# Patient Record
Sex: Male | Born: 1995 | Race: White | Hispanic: No | Marital: Single | Smoking: Never smoker
Health system: Southern US, Community
[De-identification: ages and names within clinical notes are randomized; demographics above are authoritative.]

---

## 2017-01-30 ENCOUNTER — Inpatient Hospital Stay (HOSPITAL_COMMUNITY)
Admission: RE | Admit: 2017-01-30 | Discharge: 2017-02-02 | DRG: 885 | Disposition: A | Discharge status: Home / Self Care | Payer: 59 | Attending: Psychiatry | Admitting: Psychiatry

## 2017-01-30 ENCOUNTER — Encounter (HOSPITAL_COMMUNITY): Payer: Self-pay | Admitting: *Deleted

## 2017-01-30 DIAGNOSIS — G4700 Insomnia, unspecified: Secondary | ICD-10-CM | POA: Diagnosis present

## 2017-01-30 DIAGNOSIS — F1410 Cocaine abuse, uncomplicated: Secondary | ICD-10-CM | POA: Diagnosis present

## 2017-01-30 DIAGNOSIS — Z818 Family history of other mental and behavioral disorders: Secondary | ICD-10-CM | POA: Diagnosis not present

## 2017-01-30 DIAGNOSIS — F1110 Opioid abuse, uncomplicated: Secondary | ICD-10-CM | POA: Diagnosis present

## 2017-01-30 DIAGNOSIS — F1120 Opioid dependence, uncomplicated: Secondary | ICD-10-CM | POA: Diagnosis present

## 2017-01-30 DIAGNOSIS — Z915 Personal history of self-harm: Secondary | ICD-10-CM | POA: Diagnosis not present

## 2017-01-30 DIAGNOSIS — F322 Major depressive disorder, single episode, severe without psychotic features: Secondary | ICD-10-CM | POA: Diagnosis present

## 2017-01-30 DIAGNOSIS — F332 Major depressive disorder, recurrent severe without psychotic features: Secondary | ICD-10-CM | POA: Diagnosis not present

## 2017-01-30 DIAGNOSIS — F1124 Opioid dependence with opioid-induced mood disorder: Secondary | ICD-10-CM

## 2017-01-30 DIAGNOSIS — F419 Anxiety disorder, unspecified: Secondary | ICD-10-CM | POA: Diagnosis present

## 2017-01-30 DIAGNOSIS — F39 Unspecified mood [affective] disorder: Secondary | ICD-10-CM | POA: Diagnosis not present

## 2017-01-30 LAB — RAPID URINE DRUG SCREEN, HOSP PERFORMED
Amphetamines: NOT DETECTED
Barbiturates: NOT DETECTED
Benzodiazepines: NOT DETECTED
COCAINE: POSITIVE — AB
OPIATES: POSITIVE — AB
Tetrahydrocannabinol: NOT DETECTED

## 2017-01-30 LAB — URINALYSIS, ROUTINE W REFLEX MICROSCOPIC
BILIRUBIN URINE: NEGATIVE
Glucose, UA: NEGATIVE mg/dL
HGB URINE DIPSTICK: NEGATIVE
Ketones, ur: NEGATIVE mg/dL
Leukocytes, UA: NEGATIVE
NITRITE: NEGATIVE
PROTEIN: NEGATIVE mg/dL
SPECIFIC GRAVITY, URINE: 1.013 (ref 1.005–1.030)
pH: 8 (ref 5.0–8.0)

## 2017-01-30 LAB — COMPREHENSIVE METABOLIC PANEL
ALBUMIN: 4.4 g/dL (ref 3.5–5.0)
ALT: 12 U/L — ABNORMAL LOW (ref 17–63)
ANION GAP: 10 (ref 5–15)
AST: 24 U/L (ref 15–41)
Alkaline Phosphatase: 80 U/L (ref 38–126)
BILIRUBIN TOTAL: 0.4 mg/dL (ref 0.3–1.2)
BUN: 10 mg/dL (ref 6–20)
CHLORIDE: 100 mmol/L — AB (ref 101–111)
CO2: 23 mmol/L (ref 22–32)
Calcium: 9.4 mg/dL (ref 8.9–10.3)
Creatinine, Ser: 0.96 mg/dL (ref 0.61–1.24)
GFR calc Af Amer: >60 mL/min (ref >60)
GFR calc non Af Amer: >60 mL/min (ref >60)
GLUCOSE: 119 mg/dL — AB (ref 65–99)
POTASSIUM: 3.6 mmol/L (ref 3.5–5.1)
Sodium: 133 mmol/L — ABNORMAL LOW (ref 135–145)
TOTAL PROTEIN: 7.6 g/dL (ref 6.5–8.1)

## 2017-01-30 LAB — LIPID PANEL
Cholesterol: 130 mg/dL (ref 0–200)
HDL: 54 mg/dL (ref >40)
LDL CALC: 49 mg/dL (ref 0–99)
TRIGLYCERIDES: 135 mg/dL (ref <150)
Total CHOL/HDL Ratio: 2.4 RATIO
VLDL: 27 mg/dL (ref 0–40)

## 2017-01-30 LAB — TSH: TSH: 0.76 u[IU]/mL (ref 0.350–4.500)

## 2017-01-30 LAB — HEMOGLOBIN A1C
HEMOGLOBIN A1C: 4.8 % (ref 4.8–5.6)
Mean Plasma Glucose: 91.06 mg/dL

## 2017-01-30 LAB — CBC
HCT: 43 % (ref 39.0–52.0)
Hemoglobin: 14.6 g/dL (ref 13.0–17.0)
MCH: 27.3 pg (ref 26.0–34.0)
MCHC: 34 g/dL (ref 30.0–36.0)
MCV: 80.4 fL (ref 78.0–100.0)
PLATELETS: 186 10*3/uL (ref 150–400)
RBC: 5.35 MIL/uL (ref 4.22–5.81)
RDW: 13.8 % (ref 11.5–15.5)
WBC: 5.6 10*3/uL (ref 4.0–10.5)

## 2017-01-30 MED ORDER — HYDROXYZINE HCL 25 MG PO TABS
25.0000 mg | ORAL_TABLET | Freq: Three times a day (TID) | ORAL | Status: DC | PRN
  Administered 2017-01-30 – 2017-02-01 (×4): 25 mg via ORAL
  Filled 2017-01-30 (×5): qty 1

## 2017-01-30 MED ORDER — LOPERAMIDE HCL 2 MG PO CAPS: 2.0000 mg | ORAL_CAPSULE | ORAL | Status: DC | PRN

## 2017-01-30 MED ORDER — CLONIDINE HCL 0.1 MG PO TABS
0.1000 mg | ORAL_TABLET | ORAL | Status: DC
  Administered 2017-02-02: 0.1 mg via ORAL
  Filled 2017-01-30 (×4): qty 1

## 2017-01-30 MED ORDER — TRAZODONE HCL 50 MG PO TABS
50.0000 mg | ORAL_TABLET | Freq: Every evening | ORAL | Status: DC | PRN
  Administered 2017-01-30 – 2017-02-01 (×3): 50 mg via ORAL
  Filled 2017-01-30 (×3): qty 1

## 2017-01-30 MED ORDER — NAPROXEN 500 MG PO TABS
500.0000 mg | ORAL_TABLET | Freq: Two times a day (BID) | ORAL | Status: DC | PRN
  Administered 2017-01-30 – 2017-02-01 (×2): 500 mg via ORAL
  Filled 2017-01-30 (×3): qty 1

## 2017-01-30 MED ORDER — ESCITALOPRAM OXALATE 5 MG PO TABS
5.0000 mg | ORAL_TABLET | Freq: Every day | ORAL | Status: DC
  Administered 2017-01-31 – 2017-02-02 (×3): 5 mg via ORAL
  Filled 2017-01-30 (×5): qty 1

## 2017-01-30 MED ORDER — ONDANSETRON 4 MG PO TBDP
4.0000 mg | ORAL_TABLET | Freq: Four times a day (QID) | ORAL | Status: DC | PRN
  Administered 2017-01-31: 4 mg via ORAL
  Filled 2017-01-30: qty 1

## 2017-01-30 MED ORDER — METHOCARBAMOL 500 MG PO TABS
500.0000 mg | ORAL_TABLET | Freq: Three times a day (TID) | ORAL | Status: DC | PRN
  Administered 2017-01-30 – 2017-02-01 (×2): 500 mg via ORAL
  Filled 2017-01-30 (×3): qty 1

## 2017-01-30 MED ORDER — DICYCLOMINE HCL 20 MG PO TABS
20.0000 mg | ORAL_TABLET | Freq: Four times a day (QID) | ORAL | Status: DC | PRN
  Administered 2017-01-31 – 2017-02-01 (×2): 20 mg via ORAL
  Filled 2017-01-30 (×2): qty 1

## 2017-01-30 MED ORDER — CLONIDINE HCL 0.1 MG PO TABS
0.1000 mg | ORAL_TABLET | Freq: Every day | ORAL | Status: DC
  Filled 2017-01-30: qty 1

## 2017-01-30 MED ORDER — CLONIDINE HCL 0.1 MG PO TABS
0.1000 mg | ORAL_TABLET | Freq: Four times a day (QID) | ORAL | Status: AC
  Administered 2017-01-30 – 2017-02-01 (×7): 0.1 mg via ORAL
  Filled 2017-01-30 (×12): qty 1

## 2017-01-30 MED ORDER — ACETAMINOPHEN 325 MG PO TABS: 650.0000 mg | ORAL_TABLET | Freq: Four times a day (QID) | ORAL | Status: DC | PRN

## 2017-01-30 MED ORDER — MAGNESIUM HYDROXIDE 400 MG/5ML PO SUSP: 30.0000 mL | Freq: Every day | ORAL | Status: DC | PRN

## 2017-01-30 MED ORDER — ALUM & MAG HYDROXIDE-SIMETH 200-200-20 MG/5ML PO SUSP: 30.0000 mL | ORAL | Status: DC | PRN

## 2017-01-30 NOTE — H&P (Signed)
Behavioral Health Medical Screening Exam  Jared Ramirez is an 21 y.o. male who [facility] with c/o overwhelming depression and suicide ideations..  Total Time spent with patient: 30 minutes  Psychiatric Specialty Exam: Physical Exam  Vitals reviewed. Constitutional: He is oriented to person, place, and time. He appears well-developed and well-nourished.  HENT:  Head: Normocephalic and atraumatic.  Eyes: Pupils are equal, round, and reactive to light.  Neck: Normal range of motion.  Cardiovascular: Normal rate, regular rhythm and normal heart sounds.   Respiratory: Effort normal and breath sounds normal.  GI: Soft. Bowel sounds are normal.  Musculoskeletal: Normal range of motion.  Neurological: He is alert and oriented to person, place, and time.  Skin: Skin is warm and dry.    Review of Systems  Psychiatric/Behavioral: Positive for depression, substance abuse and suicidal ideas. Negative for hallucinations and memory loss. The patient is nervous/anxious. The patient does not have insomnia.   All other systems reviewed and are negative.   Blood pressure 97/68, pulse 75, temperature 97.7 F (36.5 C), temperature source Oral, resp. rate 18, height 5\' 8"  (1.727 m), weight 61.2 kg (135 lb), SpO2 99 %.Body mass index is 20.53 kg/m.  General Appearance: Casual  Eye Contact:  Minimal  Speech:  Clear and Coherent and Normal Rate  Volume:  Normal  Mood:  Anxious and Depressed  Affect:  Congruent  Thought Process:  Coherent and Goal Directed  Orientation:  Full (Time, Place, and Person)  Thought Content:  WDL and Logical  Suicidal Thoughts:  Yes.  with intent/plan  Homicidal Thoughts:  No  Memory:  Immediate;   Good Recent;   Good Remote;   Fair  Judgement:  Fair  Insight:  Present  Psychomotor Activity:  Normal  Concentration: Concentration: Good and Attention Span: Good  Recall:  Good  Fund of Knowledge:Good  Language: Good  Akathisia:  Negative  Handed:   Right  AIMS (if indicated):     Assets:  Communication Skills Desire for Improvement Financial Resources/Insurance Housing Intimacy Leisure Time Physical Health Resilience Social Support  Sleep:       Musculoskeletal: Strength & Muscle Tone: within normal limits Gait & Station: normal Patient leans: N/A  Blood pressure 97/68, pulse 75, temperature 97.7 F (36.5 C), temperature source Oral, resp. rate 18, height 5\' 8"  (1.727 m), weight 61.2 kg (135 lb), SpO2 99 %.  Recommendations:  Based on my evaluation the patient appears to have an emergency condition for which I recommend the patient be admitted inpatient for medication management and stabilization.   Delila PereyraJustina A Okonkwo, NP 01/30/2017, 4:07 PM
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
## 2017-01-30 NOTE — H&P (Signed)
Psychiatric Admission Assessment Adult  Patient Identification: Jared HertzChristian Ramirez MRN:  [id number] Date of Evaluation:  01/30/2017 Chief Complaint:  " I got to the point I was suicidal" Principal Diagnosis: MDD (major depressive disorder), opiate dependence, opiate induced mood disorder  Diagnosis:   Patient Active Problem List   Diagnosis Date Noted  . MDD (major depressive disorder) [F32.9] 01/30/2017   History of Present Illness: 21 year old single male, presented to ED voluntarily due to worsening depression , suicidal ideations. States he had recently been thinking of  hanging self, and yesterday did actually tie a belt for the purpose , but states bar he had placed a belt on snapped, at which time he decided to come to hospital. States " I have been feeling very depressed for a few months, but I have been feeling suicidal for the last week". Endorses neuro vegetative symptoms as below. Denies psychotic symptoms. He reports opiate dependence, and has been using IV heroin daily . He relapsed in August 2018 following 11 months of sobriety. Associated Signs/Symptoms: Depression Symptoms:  depressed mood, anhedonia, insomnia, suicidal thoughts with specific plan, anxiety, loss of energy/fatigue, decreased appetite, (Hypo) Manic Symptoms: denies  Anxiety Symptoms:  Reports increasing anxiety/excessive worry  Psychotic Symptoms: no  PTSD Symptoms: denies   Total Time spent with patient: 45 minutes  Past Psychiatric History: no prior psychiatric admissions, states he attempted suicide in the past by cutting self, did not require sutures . Denies other history of self cutting. Denies history of mania, denies history of psychosis. Reports history of depression and anxiety, both worsened /aggravated during periods of active drug use .   Is the patient at risk to self? Yes.    Has the patient been a risk to self in the past 6 months? Yes.    Has the patient been a risk to self within the  distant past? No.  Is the patient a risk to others? No.  Has the patient been a risk to others in the past 6 months? No.  Has the patient been a risk to others within the distant past? No.   Prior Inpatient Therapy: Prior Inpatient Therapy: Yes Prior Therapy Dates:  2 years ago [facility] Reason for Treatment: substance use (heroin)  Prior Outpatient Therapy: Prior Outpatient Therapy: No Does patient have an ACCT team?: No Does patient have Intensive In-House Services?  : No Does patient have [facility] services? : No Does patient have P4[facility] services?: No  Alcohol Screening: 1. How often do you have a drink containing alcohol?: Never 9. Have you or someone else been injured as a result of your drinking?: No 10. Has a relative or friend or a doctor or another health worker been concerned about your drinking or suggested you cut down?: No Alcohol Use Disorder Identification Test Final Score (AUDIT): 0 Brief Intervention: AUDIT score less than 7 or less-screening does not suggest unhealthy drinking-brief intervention not indicated Substance Abuse History in the last 12 months: yes, identifies opiates as substance of choice and has been using daily IV. Consequences of Substance Abuse: History of broken relationships and legal issues in the past due to drug abuse  Previous Psychotropic Medications: states he was on antidepressant in high school but does not remember name. Not on any psychiatric medications at this time. Psychological Evaluations:  No  Past Medical History: History reviewed. No pertinent past medical history. History reviewed. No pertinent surgical history. Family History: parents alive, separated, has  two siblings, he reports he is close to mother Family Psychiatric  History: mother has history of depression and anxiety , grandmother had history of substance abuse, no suicides in family  Tobacco  Screening: Have you used any form of tobacco in the last 30 days? (Cigarettes, Smokeless Tobacco, Cigars, and/or Pipes): No Social History: 52 year old single male, no children, lives with roommate, currently unemployed. Denies legal issues . History  Alcohol use Not on file     History  Drug use: Unknown    Additional Social History: Marital status: Single    Pain Medications: see MAR Prescriptions: see MAR Over the Counter: see MAR History of alcohol / drug use?: Yes Negative Consequences of Use: Financial, Work / School Withdrawal Symptoms: Patient aware of relationship between substance abuse and physical/medical complications Name of Substance 1: Heroin 1 - Age of First Use: 18 1 - Amount (size/oz): 1 1/2 gram daily 1 - Frequency: daily 1 - Duration: ongoing 1 - Last Use / Amount: 01/29/2017  Allergies:  No Known Allergies Lab Results: No results found for this or any previous visit (from the past 48 hour(s)).  Blood Alcohol level:  No results found for: [facility]  Metabolic Disorder Labs:  No results found for: HGBA1C, MPG No results found for: PROLACTIN No results found for: CHOL, TRIG, HDL, CHOLHDL, VLDL, LDLCALC  Current Medications: Current Facility-Administered Medications  Medication Dose Route Frequency Provider Last Rate Last Dose  . alum & mag hydroxide-simeth (MAALOX/MYLANTA) 200-200-20 MG/5ML suspension 30 mL  30 mL Oral Q4H PRN Okonkwo, Justina A, NP      . cloNIDine (CATAPRES) tablet 0.1 mg  0.1 mg Oral QID Money, Gerlene Burdock, FNP       Followed by  . [START ON 02/02/2017] cloNIDine (CATAPRES) tablet 0.1 mg  0.1 mg Oral BH-qamhs Money, Gerlene Burdock, FNP       Followed by  . [START ON 02/04/2017] cloNIDine (CATAPRES) tablet 0.1 mg  0.1 mg Oral QAC breakfast Money, Feliz Beam B, FNP      . dicyclomine (BENTYL) tablet 20 mg  20 mg Oral Q6H PRN Money, Gerlene Burdock, FNP      . hydrOXYzine (ATARAX/VISTARIL) tablet 25 mg  25 mg Oral TID PRN Beryle Lathe, Justina A, NP      . loperamide  (IMODIUM) capsule 2-4 mg  2-4 mg Oral PRN Money, Feliz Beam B, FNP      . magnesium hydroxide (MILK OF MAGNESIA) suspension 30 mL  30 mL Oral Daily PRN Okonkwo, Justina A, NP      . methocarbamol (ROBAXIN) tablet 500 mg  500 mg Oral Q8H PRN Money, Gerlene Burdock, FNP      . naproxen (NAPROSYN) tablet 500 mg  500 mg Oral BID PRN Money, Gerlene Burdock, FNP      . ondansetron (ZOFRAN-ODT) disintegrating tablet 4 mg  4 mg Oral Q6H PRN Money, Gerlene Burdock, FNP      . traZODone (DESYREL) tablet 50 mg  50 mg Oral QHS PRN Okonkwo, Justina A, NP       PTA Medications: No prescriptions prior to admission.    Musculoskeletal: Strength & Muscle Tone: within normal limits Gait & Station: normal Patient leans: N/A  Psychiatric Specialty Exam: Physical Exam  Review of Systems  Constitutional: Negative.   HENT: Negative.   Eyes: Negative.   Respiratory: Negative.   Cardiovascular: Negative.   Gastrointestinal: Positive for nausea. Negative for diarrhea and vomiting.  Genitourinary: Negative.   Musculoskeletal: Positive for myalgias.  Skin: Negative.  Neurological: Negative for seizures.  Endo/Heme/Allergies: Negative.   Psychiatric/Behavioral: Positive for depression, substance abuse and suicidal ideas.  All other systems reviewed and are negative.   Blood pressure 120/60, pulse (!) 59, temperature 97.7 F (36.5 C), temperature source Oral, resp. rate 18, height 5\' 8"  (1.727 m), weight 61.2 kg (135 lb), SpO2 99 %.Body mass index is 20.53 kg/m.  General Appearance: Fairly Groomed  Eye Contact:  Fair  Speech:  Normal Rate  Volume:  Normal  Mood:  Anxious and Depressed  Affect:  Constricted and anxious  Thought Process:  Linear and Descriptions of Associations: Intact  Orientation:  Other:  fully alert and attentive  Thought Content:  denies hallucinations, not internally preoccupied,no delusions expressed   Suicidal Thoughts:  No denies any current suicidal plan or intention and contracts for safety on unit,  denies homicidal ideations  Homicidal Thoughts:  No  Memory:  recent and remote grossly intact   Judgement:  Fair  Insight:  Fair  Psychomotor Activity:  Normal  Concentration:  Concentration: Good and Attention Span: Good  Recall:  Good  Fund of Knowledge:  Good  Language:  Good  Akathisia:  No  Handed:  Right  AIMS (if indicated):     Assets:  Communication Skills Desire for Improvement Resilience  ADL's:  Intact  Cognition:  WNL  Sleep:       Treatment Plan Summary: Daily contact with patient to assess and evaluate symptoms and progress in treatment, Medication management, Plan inpatient treatment  and medications as below   Observation Level/Precautions:  15 minute checks  Laboratory:  as needed  writer offered Hepatitis /HIV testing based on history of IVDA, patient states he was recently tested and declines retesting at this time  Psychotherapy:  Milieu, groups   Medications:  Start Lexapro 5 mgrs QDAY for depression, anxiety. On Opiate detox protocol with Clonidine   Consultations:  As needed   Discharge Concerns:  -  Estimated LOS: 5 days   Other:     Physician Treatment Plan for Primary Diagnosis: Opiate Dependence, Opiate Induced Mood Disorder Long Term Goal(s): Improvement in symptoms so as ready for discharge  Short Term Goals: Ability to identify triggers associated with substance abuse/mental health issues will improve  Physician Treatment Plan for Secondary Diagnosis: Principal Problem:   MDD (major depressive disorder)  Long Term Goal(s): Improvement in symptoms so as ready for discharge  Short Term Goals: Ability to verbalize feelings will improve, Ability to disclose and discuss suicidal ideas, Ability to demonstrate self-control will improve, Ability to identify and develop effective coping behaviors will improve and Ability to maintain clinical measurements within normal limits will improve  I certify that inpatient services furnished can reasonably be  expected to improve the patient's condition.    Craige Cotta, MD 10/30/20185:31 PM
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
## 2017-01-30 NOTE — Tx Team (Signed)
Initial Treatment Plan 01/30/2017 12:36 PM Jared HertzChristian Ramirez WUJ:[id number]    PATIENT STRESSORS: Financial difficulties Occupational concerns Substance abuse   PATIENT STRENGTHS: Ability for insight Average or above average intelligence Capable of independent living Communication skills General fund of knowledge Motivation for treatment/growth Supportive family/friends   PATIENT IDENTIFIED PROBLEMS:   "I want to get off drugs."    "I want to get a better mindset."               DISCHARGE CRITERIA:  Improved stabilization in mood, thinking, and/or behavior Need for constant or close observation no longer present Reduction of life-threatening or endangering symptoms to within safe limits Verbal commitment to aftercare and medication compliance Withdrawal symptoms are absent or subacute and managed without 24-hour nursing intervention  PRELIMINARY DISCHARGE PLAN: Attend 12-step recovery group Return to previous living arrangement  PATIENT/FAMILY INVOLVEMENT: This treatment plan has been presented to and reviewed with the patient, Jared HertzChristian Ramirez, and/or family member.  The patient and family have been given the opportunity to ask questions and make suggestions.  Lawrence MarseillesFriedman, Samariya Rockhold Eakes, RN 01/30/2017, 12:36 PM

## 2017-01-31 DIAGNOSIS — G4700 Insomnia, unspecified: Secondary | ICD-10-CM

## 2017-01-31 DIAGNOSIS — F1110 Opioid abuse, uncomplicated: Secondary | ICD-10-CM

## 2017-01-31 DIAGNOSIS — F39 Unspecified mood [affective] disorder: Secondary | ICD-10-CM

## 2017-01-31 DIAGNOSIS — F1410 Cocaine abuse, uncomplicated: Secondary | ICD-10-CM

## 2017-01-31 NOTE — Progress Notes (Signed)
[facility] MD Progress Note  01/31/2017 3:03 PM Jared Ramirez  MRN:  [id number]   Subjective:  Patient reports that he feels good and the medications are working great. He denies any SI/HI/AVH and depression. He states he plans to go back to work and his apartment after discharge and he wants outpatient rehab treatment. He denies any withdrawal symptoms.   Objective: Patient's chart and findings reviewed and discussed with treatment team. Patient is pleasant and cooperative. He will continue current medications and detox protocol. He refused inpatient rehab, but wants outpatient rehab, because of his apartment and work.  Principal Problem: MDD (major depressive disorder), severe ([place]) Diagnosis:   Patient Active Problem List   Diagnosis Date Noted  . Opiate abuse, continuous ([place]) [F11.10] 01/31/2017  . Cocaine abuse ([place]) [F14.10] 01/31/2017  . MDD (major depressive disorder), severe ([place]) [F32.2] 01/30/2017   Total Time spent with patient: 15 minutes  Past Psychiatric History: See H&P  Past Medical History: History reviewed. No pertinent past medical history. History reviewed. No pertinent surgical history. Family History: History reviewed. No pertinent family history. Family Psychiatric  History: See H&P Social History:  History  Alcohol use Not on file     History  Drug use: Unknown    Social History   Social History  . Marital status: Single    Spouse name: N/A  . Number of children: N/A  . Years of education: N/A   Social History Main Topics  . Smoking status: Never Smoker  . Smokeless tobacco: Never Used  . Alcohol use None  . Drug use: Unknown  . Sexual activity: Not Asked   Other Topics Concern  . None   Social History Narrative  . None   Additional Social History:    Pain Medications: see MAR Prescriptions: see MAR Over the Counter: see MAR History of alcohol / drug use?: Yes Negative Consequences of Use: Financial, Work / School Withdrawal Symptoms:  Patient aware of relationship between substance abuse and physical/medical complications Name of Substance 1: Heroin 1 - Age of First Use: 18 1 - Amount (size/oz): 1 1/2 gram daily 1 - Frequency: daily 1 - Duration: ongoing 1 - Last Use / Amount: 01/29/2017                  Sleep: Good  Appetite:  Good  Current Medications: Current Facility-Administered Medications  Medication Dose Route Frequency Provider Last Rate Last Dose  . alum & mag hydroxide-simeth (MAALOX/MYLANTA) 200-200-20 MG/5ML suspension 30 mL  30 mL Oral Q4H PRN Okonkwo, Justina A, NP      . cloNIDine (CATAPRES) tablet 0.1 mg  0.1 mg Oral QID Money, Darnelle Maffucci B, FNP   0.1 mg at 01/31/17 1300   Followed by  . [START ON 02/02/2017] cloNIDine (CATAPRES) tablet 0.1 mg  0.1 mg Oral BH-qamhs Money, Lowry Ram, FNP       Followed by  . [START ON 02/04/2017] cloNIDine (CATAPRES) tablet 0.1 mg  0.1 mg Oral QAC breakfast Money, Darnelle Maffucci B, FNP      . dicyclomine (BENTYL) tablet 20 mg  20 mg Oral Q6H PRN Money, Lowry Ram, FNP   20 mg at 01/31/17 0817  . escitalopram (LEXAPRO) tablet 5 mg  5 mg Oral Daily Cobos, Myer Peer, MD   5 mg at 01/31/17 0800  . hydrOXYzine (ATARAX/VISTARIL) tablet 25 mg  25 mg Oral TID PRN Hughie Closs A, NP   25 mg at 01/30/17 2123  . loperamide (IMODIUM) capsule 2-4 mg  2-4  mg Oral PRN Money, Lowry Ram, FNP      . magnesium hydroxide (MILK OF MAGNESIA) suspension 30 mL  30 mL Oral Daily PRN Lu Duffel, Justina A, NP      . methocarbamol (ROBAXIN) tablet 500 mg  500 mg Oral Q8H PRN Money, Lowry Ram, FNP   500 mg at 01/30/17 2123  . naproxen (NAPROSYN) tablet 500 mg  500 mg Oral BID PRN Money, Lowry Ram, FNP   500 mg at 01/30/17 2123  . ondansetron (ZOFRAN-ODT) disintegrating tablet 4 mg  4 mg Oral Q6H PRN Money, Lowry Ram, FNP   4 mg at 01/31/17 0817  . traZODone (DESYREL) tablet 50 mg  50 mg Oral QHS PRN Hughie Closs A, NP   50 mg at 01/30/17 2123    Lab Results:  Results for orders placed or performed  during the hospital encounter of 01/30/17 (from the past 48 hour(s))  Urinalysis, Routine w reflex microscopic     Status: Abnormal   Collection Time: 01/30/17  5:43 PM  Result Value Ref Range   Color, Urine YELLOW YELLOW   APPearance HAZY (A) CLEAR   Specific Gravity, Urine 1.013 1.005 - 1.030   pH 8.0 5.0 - 8.0   Glucose, UA NEGATIVE NEGATIVE mg/dL   Hgb urine dipstick NEGATIVE NEGATIVE   Bilirubin Urine NEGATIVE NEGATIVE   Ketones, ur NEGATIVE NEGATIVE mg/dL   Protein, ur NEGATIVE NEGATIVE mg/dL   Nitrite NEGATIVE NEGATIVE   Leukocytes, UA NEGATIVE NEGATIVE    Comment: Performed at [facility], [place] [address] [place], [place] [address]  Urine rapid drug screen (hosp performed)not at [facility]     Status: Abnormal   Collection Time: 01/30/17  5:43 PM  Result Value Ref Range   Opiates POSITIVE (A) NONE DETECTED   Cocaine POSITIVE (A) NONE DETECTED   Benzodiazepines NONE DETECTED NONE DETECTED   Amphetamines NONE DETECTED NONE DETECTED   Tetrahydrocannabinol NONE DETECTED NONE DETECTED   Barbiturates NONE DETECTED NONE DETECTED    Comment:        DRUG SCREEN FOR MEDICAL PURPOSES ONLY.  IF CONFIRMATION IS NEEDED FOR ANY PURPOSE, NOTIFY LAB WITHIN 5 DAYS.        LOWEST DETECTABLE LIMITS FOR URINE DRUG SCREEN Drug Class       Cutoff (ng/mL) Amphetamine      1000 Barbiturate      200 Benzodiazepine   564 Tricyclics       332 Opiates          300 Cocaine          300 THC              50 Performed at [facility], [place] [address], [place], [place] [address]   CBC     Status: None   Collection Time: 01/30/17  6:44 PM  Result Value Ref Range   WBC 5.6 4.0 - 10.5 K/uL   RBC 5.35 4.22 - 5.81 MIL/uL   Hemoglobin 14.6 13.0 - 17.0 g/dL   HCT 43.0 39.0 - 52.0 %   MCV 80.4 78.0 - 100.0 fL   MCH 27.3 26.0 - 34.0 pg   MCHC 34.0 30.0 - 36.0 g/dL   RDW 13.8 11.5 - 15.5 %   Platelets 186 150 - 400 K/uL    Comment: Performed at [facility], [place] [address] [place], [place] [address]  Comprehensive metabolic panel     Status: Abnormal   Collection Time: 01/30/17  6:44  PM  Result Value Ref Range   Sodium 133 (L) 135 - 145 mmol/L   Potassium 3.6 3.5 - 5.1 mmol/L   Chloride 100 (L) 101 - 111 mmol/L   CO2 23 22 - 32 mmol/L   Glucose, Bld 119 (H) 65 - 99 mg/dL   BUN 10 6 - 20 mg/dL   Creatinine, Ser 0.96 0.61 - 1.24 mg/dL   Calcium 9.4 8.9 - 10.3 mg/dL   Total Protein 7.6 6.5 - 8.1 g/dL   Albumin 4.4 3.5 - 5.0 g/dL   AST 24 15 - 41 U/L   ALT 12 (L) 17 - 63 U/L   Alkaline Phosphatase 80 38 - 126 U/L   Total Bilirubin 0.4 0.3 - 1.2 mg/dL   GFR calc non Af Amer >60 >60 mL/min   GFR calc Af Amer >60 >60 mL/min    Comment: (NOTE) The eGFR has been calculated using the CKD EPI equation. This calculation has not been validated in all clinical situations. eGFR's persistently <60 mL/min signify possible Chronic Kidney Disease.    Anion gap 10 5 - 15    Comment: Performed at [facility] [address], [place], [place] [address]  Hemoglobin A1c     Status: None   Collection Time: 01/30/17  6:44 PM  Result Value Ref Range   Hgb A1c MFr Bld 4.8 4.8 - 5.6 %    Comment: (NOTE) Pre diabetes:          5.7%-6.4% Diabetes:              >6.4% Glycemic control for   <7.0% adults with diabetes    Mean Plasma Glucose 91.06 mg/dL    Comment: Performed at [facility] [address], [place], [place] [address]  TSH     Status: None   Collection Time: 01/30/17  6:44 PM  Result Value Ref Range   TSH 0.760 0.350 - 4.500 uIU/mL    Comment: Performed by a 3rd Generation assay with a functional sensitivity of <=0.01 uIU/mL. Performed at [facility], [place] [address], [place], [place] [address]   Lipid panel     Status: None   Collection Time: 01/30/17  6:44 PM  Result Value Ref Range   Cholesterol 130 0 - 200 mg/dL   Triglycerides 135 <150 mg/dL   HDL 54 >40 mg/dL   Total CHOL/HDL Ratio 2.4  RATIO   VLDL 27 0 - 40 mg/dL   LDL Cholesterol 49 0 - 99 mg/dL    Comment:        Total Cholesterol/HDL:CHD Risk Coronary Heart Disease Risk Table                     Men   Women  1/2 Average Risk   3.4   3.3  Average Risk       5.0   4.4  2 X Average Risk   9.6   7.1  3 X Average Risk  23.4   11.0        Use the calculated Patient Ratio above and the CHD Risk Table to determine the patient's CHD Risk.        ATP III CLASSIFICATION (LDL):  <100     mg/dL   Optimal  100-129  mg/dL   Near or Above                    Optimal  130-159  mg/dL   Borderline  160-189  mg/dL   High  >190     mg/dL   Very High Performed at [facility] [address], [place], [place] [address]     Blood Alcohol level:  No results found for: [facility]  Metabolic Disorder Labs: Lab Results  Component Value Date   HGBA1C 4.8 01/30/2017   MPG 91.06 01/30/2017   No results found for: PROLACTIN Lab Results  Component Value Date   CHOL 130 01/30/2017   TRIG 135 01/30/2017   HDL 54 01/30/2017   CHOLHDL 2.4 01/30/2017   VLDL 27 01/30/2017   LDLCALC 49 01/30/2017    Physical Findings: AIMS: Facial and Oral Movements Muscles of Facial Expression: None, normal Lips and Perioral Area: None, normal Jaw: None, normal Tongue: None, normal,Extremity Movements Upper (arms, wrists, hands, fingers): None, normal Lower (legs, knees, ankles, toes): None, normal, Trunk Movements Neck, shoulders, hips: None, normal, Overall Severity Severity of abnormal movements (highest score from questions above): None, normal Incapacitation due to abnormal movements: None, normal Patient's awareness of abnormal movements (rate only patient's report): No Awareness, Dental Status Current problems with teeth and/or dentures?: No Does patient usually wear dentures?: No  CIWA:    COWS:  COWS Total Score: 5  Musculoskeletal: Strength & Muscle Tone: within normal limits Gait & Station: normal Patient leans:  N/A  Psychiatric Specialty Exam: Physical Exam  Nursing note and vitals reviewed. Constitutional: He is oriented to person, place, and time. He appears well-developed and well-nourished.  Cardiovascular: Normal rate.   Respiratory: Effort normal.  Musculoskeletal: Normal range of motion.  Neurological: He is alert and oriented to person, place, and time.  Skin: Skin is warm.    Review of Systems  Constitutional: Negative.   HENT: Negative.   Eyes: Negative.   Respiratory: Negative.   Cardiovascular: Negative.   Gastrointestinal: Negative.   Genitourinary: Negative.   Musculoskeletal: Negative.   Skin: Negative.   Neurological: Negative.   Endo/Heme/Allergies: Negative.   Psychiatric/Behavioral: Negative.     Blood pressure 104/63, pulse 66, temperature 97.7 F (36.5 C), temperature source Oral, resp. rate 12, height _0  (1.727 m), weight 61.2 kg (135 lb), SpO2 99 %.Body mass index is 20.53 kg/m.  General Appearance: Casual  Eye Contact:  Good  Speech:  Clear and Coherent and Normal Rate  Volume:  Normal  Mood:  Euthymic  Affect:  Appropriate  Thought Process:  Goal Directed and Descriptions of Associations: Intact  Orientation:  Full (Time, Place, and Person)  Thought Content:  WDL  Suicidal Thoughts:  No  Homicidal Thoughts:  No  Memory:  Immediate;   Good Recent;   Good Remote;   Good  Judgement:  Fair  Insight:  Fair  Psychomotor Activity:  Normal  Concentration:  Concentration: Good and Attention Span: Good  Recall:  Good  Fund of Knowledge:  Good  Language:  Good  Akathisia:  No  Handed:  Right  AIMS (if indicated):     Assets:  Communication Skills Desire for Improvement Financial Resources/Insurance Housing Physical Health Social Support Transportation  ADL's:  Intact  Cognition:  WNL  Sleep:  Number of Hours: 6.75   Problems Addressed: MDD severe Opiate abuse Cocaine abuse  Treatment Plan Summary: Daily contact with patient to assess and  evaluate symptoms and progress in treatment, Medication management and Plan is to:  -Continue Clonidine Detox Protocol -Continue Trazodone 50 mg PO QHS PRN for insomnia -Continue Lexapro 5 mg PO Daily for mood stability -Encourage group therapy  participation  Lewis Shock, FNP 01/31/2017, 3:03 PM   Agree with NP Progress Note

## 2017-01-31 NOTE — Progress Notes (Signed)
Pt is new to the unit earlier in the day after [facility] .  He reports that he tried to hang himself, but the closet rod broke, so he decided he needed to come to the hospital for help.  He denies thoughts of suicide at this time.  He denies HI/AVH.  He has also been abusing opiates, and says he he is beginning withdrawal symptoms.  He was medicated for his symptoms per the Clonidine protocol.  He has been isolative, with minimal interaction with peers observed.  He was pleasant and cooperative with staff.  Pt was encouraged to make his needs known to staff.  Support and encouragement offered.  Discharge plans are in process.  Safety maintained with q15 minute checks.
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
## 2017-01-31 NOTE — Progress Notes (Signed)
Patient ID: Jared Ramirez, male   DOB: April 01, 1996, 21 y.o.   MRN: [id number] D) Pt affect flat, depressed. Eye contact brief. Pt guarded, cautious, minimizing. Minimal interaction in the milieu. Pt denies s.i. H.i. Or avh.  C/o malaise this morning and nausea. Has had no further physical c/o. A) Level 3 obs for safety, support and encouragement provided. Med ed initiated. Contract for safety. R) Guarded. Cooperative.

## 2017-01-31 NOTE — BHH Group Notes (Signed)
Adult Psychoeducational Group Note  Date:  01/31/2017 Time:  4:12 AM  Group Topic/Focus:  Wrap-Up Group:   The focus of this group is to help patients review their daily goal of treatment and discuss progress on daily workbooks.  Participation Level:  Did Not Attend  Participation Quality:  did not attend  Affect:  did not attend  Cognitive:  did not attend  Insight: None  Engagement in Group:  did not attend  Modes of Intervention:  did not attend  Additional Comments: pt did not attend group.   Chrisandra NettersOctavia A Jeramyah Goodpasture 01/31/2017, 4:12 AM

## 2017-01-31 NOTE — Tx Team (Cosign Needed)
Interdisciplinary Treatment and Diagnostic Plan Update 01/31/2017 Time of Session: 9:30am  Jared Ramirez  MRN: [id number]  Principal Diagnosis: MDD (major depressive disorder)  Secondary Diagnoses: Principal Problem:   MDD (major depressive disorder)   Current Medications:  Current Facility-Administered Medications  Medication Dose Route Frequency Provider Last Rate Last Dose  . alum & mag hydroxide-simeth (MAALOX/MYLANTA) 200-200-20 MG/5ML suspension 30 mL  30 mL Oral Q4H PRN Okonkwo, Justina A, NP      . cloNIDine (CATAPRES) tablet 0.1 mg  0.1 mg Oral QID Money, Darnelle Maffucci B, FNP   0.1 mg at 01/31/17 0814   Followed by  . [START ON 02/02/2017] cloNIDine (CATAPRES) tablet 0.1 mg  0.1 mg Oral BH-qamhs Money, Lowry Ram, FNP       Followed by  . [START ON 02/04/2017] cloNIDine (CATAPRES) tablet 0.1 mg  0.1 mg Oral QAC breakfast Money, Darnelle Maffucci B, FNP      . dicyclomine (BENTYL) tablet 20 mg  20 mg Oral Q6H PRN Money, Lowry Ram, FNP   20 mg at 01/31/17 0817  . escitalopram (LEXAPRO) tablet 5 mg  5 mg Oral Daily Cobos, Myer Peer, MD   5 mg at 01/31/17 0800  . hydrOXYzine (ATARAX/VISTARIL) tablet 25 mg  25 mg Oral TID PRN Hughie Closs A, NP   25 mg at 01/30/17 2123  . loperamide (IMODIUM) capsule 2-4 mg  2-4 mg Oral PRN Money, Lowry Ram, FNP      . magnesium hydroxide (MILK OF MAGNESIA) suspension 30 mL  30 mL Oral Daily PRN Lu Duffel, Justina A, NP      . methocarbamol (ROBAXIN) tablet 500 mg  500 mg Oral Q8H PRN Money, Lowry Ram, FNP   500 mg at 01/30/17 2123  . naproxen (NAPROSYN) tablet 500 mg  500 mg Oral BID PRN Money, Lowry Ram, FNP   500 mg at 01/30/17 2123  . ondansetron (ZOFRAN-ODT) disintegrating tablet 4 mg  4 mg Oral Q6H PRN Money, Lowry Ram, FNP   4 mg at 01/31/17 0817  . traZODone (DESYREL) tablet 50 mg  50 mg Oral QHS PRN Lu Duffel, Justina A, NP   50 mg at 01/30/17 2123    PTA Medications: No prescriptions prior to admission.    Treatment Modalities: Medication Management,  Group therapy, Case management,  1 to 1 session with clinician, Psychoeducation, Recreational therapy.  Patient Stressors: Financial difficulties Occupational concerns Substance abuse Patient Strengths: Ability for insight Average or above average intelligence Capable of independent living Curator fund of knowledge Motivation for treatment/growth Supportive family/friends  Physician Treatment Plan for Primary Diagnosis: MDD (major depressive disorder) Long Term Goal(s): Improvement in symptoms so as ready for discharge Short Term Goals: Ability to identify triggers associated with substance abuse/mental health issues will improve Ability to verbalize feelings will improve Ability to disclose and discuss suicidal ideas Ability to demonstrate self-control will improve Ability to identify and develop effective coping behaviors will improve Ability to maintain clinical measurements within normal limits will improve  Medication Management: Evaluate patient's response, side effects, and tolerance of medication regimen.  Therapeutic Interventions: 1 to 1 sessions, Unit Group sessions and Medication administration.  Evaluation of Outcomes: Not Met  Physician Treatment Plan for Secondary Diagnosis: Principal Problem:   MDD (major depressive disorder)  Long Term Goal(s): Improvement in symptoms so as ready for discharge  Short Term Goals: Ability to identify triggers associated with substance abuse/mental health issues will improve Ability to verbalize feelings will improve Ability to disclose and discuss suicidal ideas Ability  to demonstrate self-control will improve Ability to identify and develop effective coping behaviors will improve Ability to maintain clinical measurements within normal limits will improve  Medication Management: Evaluate patient's response, side effects, and tolerance of medication regimen.  Therapeutic Interventions: 1 to 1 sessions,  Unit Group sessions and Medication administration.  Evaluation of Outcomes: Not Met  RN Treatment Plan for Primary Diagnosis: MDD (major depressive disorder) Long Term Goal(s): Knowledge of disease and therapeutic regimen to maintain health will improve  Short Term Goals: Compliance with prescribed medications will improve  Medication Management: RN will administer medications as ordered by provider, will assess and evaluate patient's response and provide education to patient for prescribed medication. RN will report any adverse and/or side effects to prescribing provider.  Therapeutic Interventions: 1 on 1 counseling sessions, Psychoeducation, Medication administration, Evaluate responses to treatment, Monitor vital signs and CBGs as ordered, Perform/monitor CIWA, COWS, AIMS and Fall Risk screenings as ordered, Perform wound care treatments as ordered.  Evaluation of Outcomes: Not Met  LCSW Treatment Plan for Primary Diagnosis: MDD (major depressive disorder) Long Term Goal(s): Safe transition to appropriate next level of care at discharge, Engage patient in therapeutic group addressing interpersonal concerns. Short Term Goals: Engage patient in aftercare planning with referrals and resources, Facilitate patient progression through stages of change regarding substance use diagnoses and concerns, Identify triggers associated with mental health/substance abuse issues and Increase skills for wellness and recovery  Therapeutic Interventions: Assess for all discharge needs, 1 to 1 time with Social worker, Explore available resources and support systems, Assess for adequacy in community support network, Educate family and significant other(s) on suicide prevention, Complete Psychosocial Assessment, Interpersonal group therapy.  Evaluation of Outcomes: Not Met  Progress in Treatment: Attending groups: Pt is new to milieu, continuing to assess  Participating in groups: Pt is new to milieu,  continuing to assess  Taking medication as prescribed: Yes, MD continues to assess for medication changes as needed Toleration medication: Yes, no side effects reported at this time Family/Significant other contact made: No, CSW assessing for appropriate contact Patient understands diagnosis: Continuing to assess Discussing patient identified problems/goals with staff: Yes Medical problems stabilized or resolved: Yes Denies suicidal/homicidal ideation: Yes  Issues/concerns per patient self-inventory: None Other: N/A  New problem(s) identified: None identified at this time.   New Short Term/Long Term Goal(s): None identified at this time.   Discharge Plan or Barriers: CSW still assessing for an appropriate plan.  Reason for Continuation of Hospitalization:  Depression Medication stabilization Suicidal ideation Withdrawal symptoms  Estimated Length of Stay: 3-5 days; Estimated discharge date 02/05/17  Attendees: Patient: 01/31/2017 11:23 AM  Physician: Dr. Nancy Fetter 01/31/2017 11:23 AM  Nursing:  01/31/2017 11:23 AM  RN Care Manager: Lars Pinks, RN 01/31/2017 11:23 AM  Social Worker: Matthew Saras, [place] 01/31/2017 11:23 AM  Recreational Therapist:  01/31/2017 11:23 AM  Other: Lindell Spar, NP 01/31/2017 11:23 AM  Other:  01/31/2017 11:23 AM  Other: 01/31/2017 11:23 AM  Scribe for Treatment Team: Georga Kaufmann, MSW,LCSWA 01/31/2017 11:23 AM

## 2017-01-31 NOTE — [facility] Group Notes (Signed)
LCSW Group Therapy Note  01/31/2017 1:15pm  Type of Therapy/Topic:  Group Therapy:  Emotion Regulation  Participation Level: Pt invited, did not attend.   Jonathon JordanLynn B Rian Busche, MSW, LCSWA 01/31/2017 3:02 PM

## 2017-02-01 DIAGNOSIS — F332 Major depressive disorder, recurrent severe without psychotic features: Secondary | ICD-10-CM

## 2017-02-01 NOTE — Progress Notes (Signed)
[facility] MD Progress Note  02/01/2017 3:15 PM Jared Ramirez  MRN:  [id number]   Subjective:  Patient reports that he is doing good today, "I noticed a little withdrawal symptoms this morning but I'm fine now." Patient requests to be discharged and then states that his reason is to be around his support group. Patient denies any SI/HI/AVH and contracts for safety.  Objective: Patient's chart and findings reviewed and discussed with treatment team. Patient presents pleasant and cooperative. He still appears depressed with fl;at affect. He has continued to request discharge, even though he has been educated on the detox protocol. Patient will continue current medication regimen and will possibly discharge tomorrow.   Principal Problem: MDD (major depressive disorder), severe ([place]) Diagnosis:   Patient Active Problem List   Diagnosis Date Noted  . Severe episode of recurrent major depressive disorder, without psychotic features ([place]) [F33.2]   . Opiate abuse, continuous ([place]) [F11.10] 01/31/2017  . Cocaine abuse ([place]) [F14.10] 01/31/2017  . MDD (major depressive disorder), severe ([place]) [F32.2] 01/30/2017   Total Time spent with patient: 15 minutes  Past Psychiatric History: See H&P  Past Medical History: History reviewed. No pertinent past medical history. History reviewed. No pertinent surgical history. Family History: History reviewed. No pertinent family history. Family Psychiatric  History: See H&P Social History:  History  Alcohol use Not on file     History  Drug use: Unknown    Social History   Social History  . Marital status: Single    Spouse name: N/A  . Number of children: N/A  . Years of education: N/A   Social History Main Topics  . Smoking status: Never Smoker  . Smokeless tobacco: Never Used  . Alcohol use None  . Drug use: Unknown  . Sexual activity: Not Asked   Other Topics Concern  . None   Social History Narrative  . None   Additional Social History:     Pain Medications: see MAR Prescriptions: see MAR Over the Counter: see MAR History of alcohol / drug use?: Yes Negative Consequences of Use: Financial, Work / School Withdrawal Symptoms: Patient aware of relationship between substance abuse and physical/medical complications Name of Substance 1: Heroin 1 - Age of First Use: 18 1 - Amount (size/oz): 1 1/2 gram daily 1 - Frequency: daily 1 - Duration: ongoing 1 - Last Use / Amount: 01/29/2017                  Sleep: Good  Appetite:  Good  Current Medications: Current Facility-Administered Medications  Medication Dose Route Frequency Provider Last Rate Last Dose  . alum & mag hydroxide-simeth (MAALOX/MYLANTA) 200-200-20 MG/5ML suspension 30 mL  30 mL Oral Q4H PRN Okonkwo, Justina A, NP      . cloNIDine (CATAPRES) tablet 0.1 mg  0.1 mg Oral QID Money, Darnelle Maffucci B, FNP   0.1 mg at 02/01/17 0824   Followed by  . [START ON 02/02/2017] cloNIDine (CATAPRES) tablet 0.1 mg  0.1 mg Oral BH-qamhs Money, Lowry Ram, FNP       Followed by  . [START ON 02/04/2017] cloNIDine (CATAPRES) tablet 0.1 mg  0.1 mg Oral QAC breakfast Money, Darnelle Maffucci B, FNP      . dicyclomine (BENTYL) tablet 20 mg  20 mg Oral Q6H PRN Money, Lowry Ram, FNP   20 mg at 02/01/17 1048  . escitalopram (LEXAPRO) tablet 5 mg  5 mg Oral Daily Markee Remlinger, Myer Peer, MD   5 mg at 02/01/17 0823  . hydrOXYzine (  ATARAX/VISTARIL) tablet 25 mg  25 mg Oral TID PRN Lu Duffel, Justina A, NP   25 mg at 02/01/17 0825  . loperamide (IMODIUM) capsule 2-4 mg  2-4 mg Oral PRN Money, Lowry Ram, FNP      . magnesium hydroxide (MILK OF MAGNESIA) suspension 30 mL  30 mL Oral Daily PRN Okonkwo, Justina A, NP      . methocarbamol (ROBAXIN) tablet 500 mg  500 mg Oral Q8H PRN Money, Darnelle Maffucci B, FNP   500 mg at 02/01/17 1049  . naproxen (NAPROSYN) tablet 500 mg  500 mg Oral BID PRN Money, Lowry Ram, FNP   500 mg at 02/01/17 1048  . ondansetron (ZOFRAN-ODT) disintegrating tablet 4 mg  4 mg Oral Q6H PRN Money, Lowry Ram, FNP   4 mg at 01/31/17 0817  . traZODone (DESYREL) tablet 50 mg  50 mg Oral QHS PRN Hughie Closs A, NP   50 mg at 01/31/17 2123    Lab Results:  Results for orders placed or performed during the hospital encounter of 01/30/17 (from the past 48 hour(s))  Urinalysis, Routine w reflex microscopic     Status: Abnormal   Collection Time: 01/30/17  5:43 PM  Result Value Ref Range   Color, Urine YELLOW YELLOW   APPearance HAZY (A) CLEAR   Specific Gravity, Urine 1.013 1.005 - 1.030   pH 8.0 5.0 - 8.0   Glucose, UA NEGATIVE NEGATIVE mg/dL   Hgb urine dipstick NEGATIVE NEGATIVE   Bilirubin Urine NEGATIVE NEGATIVE   Ketones, ur NEGATIVE NEGATIVE mg/dL   Protein, ur NEGATIVE NEGATIVE mg/dL   Nitrite NEGATIVE NEGATIVE   Leukocytes, UA NEGATIVE NEGATIVE    Comment: Performed at [facility], [place] [address], [place], [place] [address]  Urine rapid drug screen (hosp performed)not at [facility]     Status: Abnormal   Collection Time: 01/30/17  5:43 PM  Result Value Ref Range   Opiates POSITIVE (A) NONE DETECTED   Cocaine POSITIVE (A) NONE DETECTED   Benzodiazepines NONE DETECTED NONE DETECTED   Amphetamines NONE DETECTED NONE DETECTED   Tetrahydrocannabinol NONE DETECTED NONE DETECTED   Barbiturates NONE DETECTED NONE DETECTED    Comment:        DRUG SCREEN FOR MEDICAL PURPOSES ONLY.  IF CONFIRMATION IS NEEDED FOR ANY PURPOSE, NOTIFY LAB WITHIN 5 DAYS.        LOWEST DETECTABLE LIMITS FOR URINE DRUG SCREEN Drug Class       Cutoff (ng/mL) Amphetamine      1000 Barbiturate      200 Benzodiazepine   277 Tricyclics       412 Opiates          300 Cocaine          300 THC              50 Performed at [facility], [place] [address], [place], [place] [address]   CBC     Status: None   Collection Time: 01/30/17  6:44 PM  Result Value Ref Range   WBC 5.6 4.0 - 10.5 K/uL   RBC 5.35 4.22 - 5.81 MIL/uL   Hemoglobin 14.6 13.0 - 17.0 g/dL   HCT 43.0 39.0 -  52.0 %   MCV 80.4 78.0 - 100.0 fL   MCH 27.3 26.0 - 34.0 pg   MCHC 34.0 30.0 - 36.0 g/dL   RDW 13.8 11.5 - 15.5 %   Platelets 186 150 - 400 K/uL    Comment:  Performed at [facility], [place] [address] [place], [place] [address]  Comprehensive metabolic panel     Status: Abnormal   Collection Time: 01/30/17  6:44 PM  Result Value Ref Range   Sodium 133 (L) 135 - 145 mmol/L   Potassium 3.6 3.5 - 5.1 mmol/L   Chloride 100 (L) 101 - 111 mmol/L   CO2 23 22 - 32 mmol/L   Glucose, Bld 119 (H) 65 - 99 mg/dL   BUN 10 6 - 20 mg/dL   Creatinine, Ser 0.96 0.61 - 1.24 mg/dL   Calcium 9.4 8.9 - 10.3 mg/dL   Total Protein 7.6 6.5 - 8.1 g/dL   Albumin 4.4 3.5 - 5.0 g/dL   AST 24 15 - 41 U/L   ALT 12 (L) 17 - 63 U/L   Alkaline Phosphatase 80 38 - 126 U/L   Total Bilirubin 0.4 0.3 - 1.2 mg/dL   GFR calc non Af Amer >60 >60 mL/min   GFR calc Af Amer >60 >60 mL/min    Comment: (NOTE) The eGFR has been calculated using the CKD EPI equation. This calculation has not been validated in all clinical situations. eGFR's persistently <60 mL/min signify possible Chronic Kidney Disease.    Anion gap 10 5 - 15    Comment: Performed at [facility] [address], [place], [place] [address]  Hemoglobin A1c     Status: None   Collection Time: 01/30/17  6:44 PM  Result Value Ref Range   Hgb A1c MFr Bld 4.8 4.8 - 5.6 %    Comment: (NOTE) Pre diabetes:          5.7%-6.4% Diabetes:              >6.4% Glycemic control for   <7.0% adults with diabetes    Mean Plasma Glucose 91.06 mg/dL    Comment: Performed at [facility] [address], [place],  [address]  TSH     Status: None   Collection Time: 01/30/17  6:44 PM  Result Value Ref Range   TSH 0.760 0.350 - 4.500 uIU/mL    Comment: Performed by a 3rd Generation assay with a functional sensitivity of <=0.01 uIU/mL. Performed at [facility], [place] [address] [place],  [address]   Lipid  panel     Status: None   Collection Time: 01/30/17  6:44 PM  Result Value Ref Range   Cholesterol 130 0 - 200 mg/dL   Triglycerides 135 <150 mg/dL   HDL 54 >40 mg/dL   Total CHOL/HDL Ratio 2.4 RATIO   VLDL 27 0 - 40 mg/dL   LDL Cholesterol 49 0 - 99 mg/dL    Comment:        Total Cholesterol/HDL:CHD Risk Coronary Heart Disease Risk Table                     Men   Women  1/2 Average Risk   3.4   3.3  Average Risk       5.0   4.4  2 X Average Risk   9.6   7.1  3 X Average Risk  23.4   11.0        Use the calculated Patient Ratio above and the CHD Risk Table to determine the patient's CHD Risk.        ATP III CLASSIFICATION (LDL):  <100     mg/dL   Optimal  100-129  mg/dL  Near or Above                    Optimal  130-159  mg/dL   Borderline  160-189  mg/dL   High  >190     mg/dL   Very High Performed at [facility] [address], [place], [place] [address]     Blood Alcohol level:  No results found for: [facility]  Metabolic Disorder Labs: Lab Results  Component Value Date   HGBA1C 4.8 01/30/2017   MPG 91.06 01/30/2017   No results found for: PROLACTIN Lab Results  Component Value Date   CHOL 130 01/30/2017   TRIG 135 01/30/2017   HDL 54 01/30/2017   CHOLHDL 2.4 01/30/2017   VLDL 27 01/30/2017   LDLCALC 49 01/30/2017    Physical Findings: AIMS: Facial and Oral Movements Muscles of Facial Expression: None, normal Lips and Perioral Area: None, normal Jaw: None, normal Tongue: None, normal,Extremity Movements Upper (arms, wrists, hands, fingers): None, normal Lower (legs, knees, ankles, toes): None, normal, Trunk Movements Neck, shoulders, hips: None, normal, Overall Severity Severity of abnormal movements (highest score from questions above): None, normal Incapacitation due to abnormal movements: None, normal Patient's awareness of abnormal movements (rate only patient's report): No Awareness, Dental Status Current problems with teeth and/or dentures?:  No Does patient usually wear dentures?: No  CIWA:  CIWA-Ar Total: 3 COWS:  COWS Total Score: 3  Musculoskeletal: Strength & Muscle Tone: within normal limits Gait & Station: normal Patient leans: N/A  Psychiatric Specialty Exam: Physical Exam  Nursing note and vitals reviewed. Constitutional: He is oriented to person, place, and time. He appears well-developed and well-nourished.  Cardiovascular: Normal rate.   Respiratory: Effort normal.  Musculoskeletal: Normal range of motion.  Neurological: He is alert and oriented to person, place, and time.  Skin: Skin is warm.    Review of Systems  Constitutional: Negative.   HENT: Negative.   Eyes: Negative.   Respiratory: Negative.   Cardiovascular: Negative.   Gastrointestinal: Negative.   Genitourinary: Negative.   Musculoskeletal: Negative.   Skin: Negative.   Neurological: Negative.   Endo/Heme/Allergies: Negative.   Psychiatric/Behavioral: Negative.     Blood pressure 140/62, pulse 68, temperature 97.6 F (36.4 C), temperature source Oral, resp. rate 18, height _0  (1.727 m), weight 61.2 kg (135 lb), SpO2 99 %.Body mass index is 20.53 kg/m.  General Appearance: Casual  Eye Contact:  Good  Speech:  Clear and Coherent and Normal Rate  Volume:  Normal  Mood:  Depressed  Affect:  Flat  Thought Process:  Goal Directed and Descriptions of Associations: Intact  Orientation:  Full (Time, Place, and Person)  Thought Content:  WDL  Suicidal Thoughts:  No  Homicidal Thoughts:  No  Memory:  Immediate;   Good Recent;   Good Remote;   Good  Judgement:  Good  Insight:  Good  Psychomotor Activity:  Normal  Concentration:  Concentration: Good and Attention Span: Good  Recall:  Good  Fund of Knowledge:  Good  Language:  Good  Akathisia:  No  Handed:  Right  AIMS (if indicated):     Assets:  Communication Skills Desire for Improvement Financial Resources/Insurance Housing Physical Health Social Support Transportation   ADL's:  Intact  Cognition:  WNL  Sleep:  Number of Hours: 6.75   Problems addressed: MDD severe Opiate abuse Cocaine abuse  Treatment Plan Summary: Daily contact with patient to assess and evaluate symptoms and progress in treatment,  Medication management and Plan is to:  -Continue Clonidine Detox Protocol -Continue Lexapro 5 mg PO Daily for mood stability -Continue Trazodone 50 mg PO QHS PRN for insomnia -Encourage group therapy participation  Lewis Shock, FNP 02/01/2017, 3:15 PM   Agree with NP Progress Note

## 2017-02-01 NOTE — [facility] Group Notes (Signed)
[facility] [employer] Group Therapy 02/01/2017 1:15pm  Type of Therapy: [employer] Presentation  Participation Level: Active  Participation Quality: Attentive  Affect: Appropriate  Cognitive: Oriented  Insight: Developing/Improving  Engagement in Therapy: Engaged  Modes of Intervention: Discussion, Education and Socialization  Summary of Progress/Problems: Mental [employer] (MHA) Speaker came to talk about his personal journey with mental health. The pt processed ways by which to relate to the speaker. MHA speaker provided handouts and educational information pertaining to groups and services offered by the MHA. Pt was engaged in speaker's presentation and was receptive to resources provided.    Koda Routon N Smart, LCSW 02/01/2017 3:26 PM  

## 2017-02-01 NOTE — [facility] Group Notes (Signed)
The focus of this group is to educate the patient on the purpose and policies of crisis stabilization and provide a format to answer questions about their admission.  The group details unit policies and expectations of patients while admitted.  Patient attended 0900 nurse education orientation group this morning.  Patient actively participated and had appropriate affect.  Patient was alert.  Patient had appropriate insight and appropriate engagements.  Today patient will work on 3 goals for discharge.   

## 2017-02-01 NOTE — Progress Notes (Signed)
Pt reports he is doing better this evening and wants to be discharged as soon as the doctor will approve.  He chose not to attend evening group.  He denies SI/HI/AVH.  He say his withdrawal symptoms are mild, but he looks as if he does not feel well.  He says he would rather finish his treatment in outpatient.  He has minimal interaction with peers.  He had a visitor this evening and said it was a good visit.  He makes his needs known to staff.  He was given a sleep aid at bedtime.  Support and encouragement offered.  Discharge plans are in process.  Safety maintained with q15 minute checks.

## 2017-02-01 NOTE — Progress Notes (Signed)
D:  Patient has been in bed most of the day.  Patient has been given multiple cups of gatorade and ginger ale to drink throughout the day.  Lunch was brought to patient who was encouraged to eat/drink.  Patient has denied SI and HI, contracts for safety.  Denied A/V hallucinations.   A:  Medications administered per MD orders.  Emotional support and encouragement given patient throughout the day. R:  Safety maintained with 15 minute checks. Patient has been sitting in dayroom this afternoon watching tv with peers.  Respirations even and unlabored.  No signs/symptoms of pain/distress noted on patient's face/body movements.

## 2017-02-01 NOTE — Progress Notes (Signed)
Psychoeducational Group Note  Date:  02/01/2017 Time:  2045  Group Topic/Focus:                                    Wrap up group  Participation Level: Did Not Attend  Participation Quality:  Not Applicable  Affect:  Not Applicable  Cognitive:  Not Applicable  Insight:  Not Applicable  Engagement in Group: Not Applicable  Additional Comments:  Pt was notified that group was beginning but remained in his bed.   Marcille BuffyMcNeil, Kenzel Ruesch S 02/01/2017, 10:44 PM

## 2017-02-01 NOTE — Plan of Care (Signed)
Problem: Health Behavior/Discharge Planning: Goal: Identification of resources available to assist in meeting health care needs will improve Outcome: Progressing Nurse discussed depression/anxiety/coping skills with patient.

## 2017-02-01 NOTE — Progress Notes (Addendum)
Clonidine 0.1 mg po not given at 1200.  BP 103/47.  NP informed.

## 2017-02-02 DIAGNOSIS — F1124 Opioid dependence with opioid-induced mood disorder: Secondary | ICD-10-CM

## 2017-02-02 MED ORDER — ESCITALOPRAM OXALATE 5 MG PO TABS: 5.0000 mg | ORAL_TABLET | Freq: Every day | ORAL | 0 refills | Status: AC

## 2017-02-02 MED ORDER — TRAZODONE HCL 50 MG PO TABS: 50.0000 mg | ORAL_TABLET | Freq: Every evening | ORAL | 0 refills | Status: AC | PRN

## 2017-02-02 NOTE — [facility] Suicide Risk Assessment (Signed)
[facility] INPATIENT:  Family/Significant Other Suicide Prevention Education  Suicide Prevention Education:  Education Completed; Jared Ramirez (pt's roommate) [phone] has been identified by the patient as the family member/significant other with whom the patient will be residing, and identified as the person(s) who will aid the patient in the event of a mental health crisis (suicidal ideations/suicide attempt).  With written consent from the patient, the family member/significant other has been provided the following suicide prevention education, prior to the and/or following the discharge of the patient.  The suicide prevention education provided includes the following:  Suicide risk factors  Suicide prevention and interventions  National Suicide Hotline telephone number  [facility] assessment telephone number  [facility] Emergency Assistance 911  [facility] and/or Residential Mobile Crisis Unit telephone number  Request made of family/significant other to:  Remove weapons (e.g., guns, rifles, knives), all items previously/currently identified as safety concern.    Remove drugs/medications (over-the-counter, prescriptions, illicit drugs), all items previously/currently identified as a safety concern.  The family member/significant other verbalizes understanding of the suicide prevention education information provided.  The family member/significant other agrees to remove the items of safety concern listed above.  Pt's roommate states that she and pt's mother visited him last night. "he still looks like he's in active withdrawal and he's still looking depressed but he doesn't want to be there anymore. I think he would benefit from another day or two." SPE reviewed and aftercare appt for Monday morning shared with pt's roommate. She reports that pt has no access to guns/firearms and that pt is able to return to the home at discharge. MD notified of above.    Jared Foushee N Smart LCSW 02/02/2017, 9:59 AM

## 2017-02-02 NOTE — Progress Notes (Signed)
  [facility] :  Will you be returning to the same living situation after discharge:  Yes,  home with roommate At discharge, do you have transportation home?: Yes,  roommate will pick him up at 1:15PM Do you have the ability to pay for your medications: Yes,  [employer]  Release of information consent forms completed and submitted to medical records by CSW.  Patient to Follow up at: Follow-up Information    BEHAVIORAL HEALTH INTENSIVE CHEMICAL DEPENDENCY Follow up on 02/05/2017.   Specialty:  Behavioral Health Why:  Assessment appointment 11/5 at 9am. Please call Ann at [phone] if you need to cancel or reschedule your appointment, or with any questions. Thank you! Contact information: [address] [id number] [place] [phone]          Next level of care provider has access to [facility] Link:yes  Safety Planning and Suicide Prevention discussed: Yes,  SPE completed with pt's roommate and friend. SPI pamphlet and Mobile Crisis information provided to pt.   Have you used any form of tobacco in the last 30 days? (Cigarettes, Smokeless Tobacco, Cigars, and/or Pipes): No  Has patient been referred to the Quitline?: N/A patient is not a smoker  Patient has been referred for addiction treatment: Yes  Pulte HomesHeather N Smart, LCSW 02/02/2017, 11:11 AM

## 2017-02-02 NOTE — Progress Notes (Signed)
D: Pt A & O X4. Denies SI, HI, AVH and pain when assessed. Presents with flat affect, fidgety on interactions and is logical during conversations. Reports she's sleeping and eating well. Rates his depression 1/10, hopelessness 0/10 and anxiety 0/10. "I'm just happy I'm going home today".  Pt d/c home as ordered. Pt was picked up in the main lobby by his friend. A: All medications administered as per MD's orders with verbal education and effects monitored. D/C instructions reviewed with pt including prescriptions and follow up appointment. Encouraged pt to comply with d/c instructions as discussed. Emotional support provided to pt. All belongings from locker 33 returned to pt. Q 15 minutes safety checks maintained on and unit till time of d/c. R: Pt verbalized understanding related to d/c instructions. Compliant with medication when offered. Denies adverse drug reactions. Pt signed belonging sheet in agreement with items received from locker. Pt ambulatory with a steady gait. Appears to be in no physical  distress at this time.

## 2017-02-02 NOTE — Discharge Summary (Signed)
Physician Discharge Summary Note  Patient:  Jared Ramirez is an 21 y.o., male MRN:  [id number] DOB:  07-Oct-1995 Patient phone:  [phone] (home)  Patient address:   [address] [place] [address],  Total Time spent with patient: 20 minutes  Date of Admission:  01/30/2017 Date of Discharge: 02/02/2017  Reason for Admission: Per H&P- 21 year old single male, presented to ED voluntarily due to worsening depression , suicidal ideations. States he had recently been thinking of  hanging self, and yesterday did actually tie a belt for the purpose , but states bar he had placed a belt on snapped, at which time he decided to come to hospital.States " I have been feeling very depressed for a few months, but I have been feeling suicidal for the last week".Endorses neuro vegetative symptoms as below. Denies psychotic symptoms.He reports opiate dependence, and has been using IV heroin daily . He relapsed in August 2018 following 11 months of sobriety.  Principal Problem: MDD (major depressive disorder), severe [facility]) Discharge Diagnoses: Patient Active Problem List   Diagnosis Date Noted  . Severe episode of recurrent major depressive disorder, without psychotic features (HCC) [F33.2]   . Opiate abuse, continuous (HCC) [F11.10] 01/31/2017  . Cocaine abuse (HCC) [F14.10] 01/31/2017  . MDD (major depressive disorder), severe (HCC) [F32.2] 01/30/2017    Past Psychiatric History:   Past Medical History: History reviewed. No pertinent past medical history. History reviewed. No pertinent surgical history. Family History: History reviewed. No pertinent family history. Family Psychiatric  History:  Social History:  History  Alcohol use Not on file     History  Drug use: Unknown    Social History   Social History  . Marital status: Single    Spouse name: N/A  . Number of children: N/A  . Years of education: N/A   Social History Main Topics  . Smoking status: Never Smoker  .  Smokeless tobacco: Never Used  . Alcohol use None  . Drug use: Unknown  . Sexual activity: Not Asked   Other Topics Concern  . None   Social History Narrative  . None    Hospital Course:  Jeyson Deshotel was admitted for MDD (major depressive disorder), severe (HCC)  and crisis management.  Pt was treated discharged with the medications listed below under Medication List.  Medical problems were identified and treated as needed.  Home medications were restarted as appropriate.  Improvement was monitored by observation and Tabari Volkert 's daily report of symptom reduction.  Emotional and mental status was monitored by daily self-inventory reports completed by Hilary Hertz and clinical staff.         Pressley Mauritz was evaluated by the treatment team for stability and plans for continued recovery upon discharge. Alexandr Rallis 's motivation was an integral factor for scheduling further treatment. Employment, transportation, bed availability, health status, family support, and any pending legal issues were also considered during hospital stay. Pt was offered further treatment options upon discharge including but not limited to Residential, Intensive Outpatient, and Outpatient treatment.  Surya Pecina will follow up with the services as listed below under Follow Up Information.     Upon completion of this admission the patient was both mentally and medically stable for discharge denying suicidal/homicidal ideation, auditory/visual/tactile hallucinations, delusional thoughts and paranoia.    Daniela Hernan responded well to treatment with Trazodone 50 mg and Lexapro 5 mg without adverse effects.Pt demonstrated improvement without reported or observed adverse effects to the point of stability  appropriate for outpatient management. Pertinent labs include: CMP  for which outpatient follow-up is necessary for lab recheck as mentioned below. Reviewed CBC, CMP, BAL, and UDS + Opiates and  cocaine ; all unremarkable aside from noted exceptions.   Physical Findings: AIMS: Facial and Oral Movements Muscles of Facial Expression: None, normal Lips and Perioral Area: None, normal Jaw: None, normal Tongue: None, normal,Extremity Movements Upper (arms, wrists, hands, fingers): None, normal Lower (legs, knees, ankles, toes): None, normal, Trunk Movements Neck, shoulders, hips: None, normal, Overall Severity Severity of abnormal movements (highest score from questions above): None, normal Incapacitation due to abnormal movements: None, normal Patient's awareness of abnormal movements (rate only patient's report): No Awareness, Dental Status Current problems with teeth and/or dentures?: No Does patient usually wear dentures?: No  CIWA:  CIWA-Ar Total: 0 COWS:  COWS Total Score: 3  Musculoskeletal: Strength & Muscle Tone: within normal limits Gait & Station: normal Patient leans: N/A  Psychiatric Specialty Exam: See SRA by MD  Physical Exam  Vitals reviewed. Constitutional: He is oriented to person, place, and time. He appears well-developed.  Cardiovascular: Normal rate.   Neurological: He is alert and oriented to person, place, and time.  Psychiatric: He has a normal mood and affect.    Review of Systems  Psychiatric/Behavioral: Negative for depression (stable) and suicidal ideas. Hallucinations: stable. The patient is not nervous/anxious.     Blood pressure (!) 126/93, pulse (!) 124, temperature 97.6 F (36.4 C), temperature source Oral, resp. rate 18, height 5\' 8"  (1.727 m), weight 61.2 kg (135 lb), SpO2 99 %.Body mass index is 20.53 kg/m.   Have you used any form of tobacco in the last 30 days? (Cigarettes, Smokeless Tobacco, Cigars, and/or Pipes): No  Has this patient used any form of tobacco in the last 30 days? (Cigarettes, Smokeless Tobacco, Cigars, and/or Pipes)  No  Blood Alcohol level:  No results found for: [facility]  Metabolic Disorder Labs:  Lab Results   Component Value Date   HGBA1C 4.8 01/30/2017   MPG 91.06 01/30/2017   No results found for: PROLACTIN Lab Results  Component Value Date   CHOL 130 01/30/2017   TRIG 135 01/30/2017   HDL 54 01/30/2017   CHOLHDL 2.4 01/30/2017   VLDL 27 01/30/2017   LDLCALC 49 01/30/2017    See Psychiatric Specialty Exam and Suicide Risk Assessment completed by Attending Physician prior to discharge.  Discharge destination:  Home  Is patient on multiple antipsychotic therapies at discharge:  No   Has Patient had three or more failed trials of antipsychotic monotherapy by history:  No  Recommended Plan for Multiple Antipsychotic Therapies: NA  Discharge Instructions    Diet - low sodium heart healthy    Complete by:  As directed    Discharge instructions    Complete by:  As directed    Take all medications as prescribed. Keep all follow-up appointments as scheduled.  Do not consume alcohol or use illegal drugs while on prescription medications. Report any adverse effects from your medications to your primary care provider promptly.  In the event of recurrent symptoms or worsening symptoms, call 911, a crisis hotline, or go to the nearest emergency department for evaluation.   Increase activity slowly    Complete by:  As directed      Allergies as of 02/02/2017   No Known Allergies     Medication List    TAKE these medications     Indication  escitalopram 5 MG tablet Commonly  known as:  LEXAPRO Take 1 tablet (5 mg total) by mouth daily.  Indication:  Major Depressive Disorder   traZODone 50 MG tablet Commonly known as:  DESYREL Take 1 tablet (50 mg total) by mouth at bedtime as needed for sleep.  Indication:  Trouble Sleeping      Follow-up Information    BEHAVIORAL HEALTH INTENSIVE CHEMICAL DEPENDENCY Follow up on 02/05/2017.   Specialty:  Behavioral Health Why:  Assessment appointment 11/5 at 9am. Please call Ann at [phone] if you need to cancel or reschedule your  appointment, or with any questions. Thank you! Contact information: [address] [id number] mc [place] [address] [phone]          Follow-up recommendations:  Activity:  as tolerated Diet:  heart healthy  Comments:  Take all medications as prescribed. Keep all follow-up appointments as scheduled.  Do not consume alcohol or use illegal drugs while on prescription medications. Report any adverse effects from your medications to your primary care provider promptly.  In the event of recurrent symptoms or worsening symptoms, call 911, a crisis hotline, or go to the nearest emergency department for evaluation.   Signed: Oneta Rack, NP 02/02/2017, 9:03 AM   Patient seen, Suicide Assessment Completed.  Disposition Plan Reviewed

## 2017-02-02 NOTE — Plan of Care (Signed)
Problem: Safety: Goal: Periods of time without injury will increase Outcome: Progressing Pt has not harmed self or others tonight.  He denies SI/HI and verbally contracts for safety.   

## 2017-02-02 NOTE — [facility] Suicide Risk Assessment (Signed)
[facility] Discharge Suicide Risk Assessment   Principal Problem: MDD (major depressive disorder), severe [facility]) Discharge Diagnoses:  Patient Active Problem List   Diagnosis Date Noted  . Severe episode of recurrent major depressive disorder, without psychotic features ([place]) [F33.2]   . Opiate abuse, continuous ([place]) [F11.10] 01/31/2017  . Cocaine abuse ([place]) [F14.10] 01/31/2017  . MDD (major depressive disorder), severe ([place]) [F32.2] 01/30/2017    Total Time spent with patient: 30 minutes  Musculoskeletal: Strength & Muscle Tone: within normal limits Gait & Station: normal Patient leans: N/A  Psychiatric Specialty Exam: ROS denies headache, no chest pain, no dyspnea, no nausea, no vomiting, no diarrhea, no fever.   Blood pressure (!) 126/93, pulse (!) 124, temperature 97.6 F (36.4 C), temperature source Oral, resp. rate 18, height _0  (1.727 m), weight 61.2 kg (135 lb), SpO2 99 %.Body mass index is 20.53 kg/m.  General Appearance: Well Groomed  Eye Contact::  Good  Speech:  Normal Rate409  Volume:  Normal  Mood:  mood is improved, denies feeling depressed   Affect:  more reactive, fuller in range, anxious   Thought Process:  Linear and Descriptions of Associations: Intact  Orientation:  Full (Time, Place, and Person)  Thought Content:  denies hallucinations , no delusions, not internally preoccupied   Suicidal Thoughts:  No denies suicidal or self injurious ideations, no homicidal or violent ideations  Homicidal Thoughts:  No  Memory:  recent and remote grossly intact   Judgement:  Other:  improving   Insight:  improving   Psychomotor Activity:  Normal  Concentration:  Good  Recall:  Good  Fund of Knowledge:Good  Language: Good  Akathisia:  Negative  Handed:  Right  AIMS (if indicated):     Assets:  Communication Skills Desire for Improvement Resilience  Sleep:  Number of Hours: 6  Cognition: WNL  ADL's:  Intact   Mental Status Per Nursing Assessment::   On  Admission:  Suicidal ideation indicated by patient, Suicide plan, Plan includes specific time, place, or method, Self-harm thoughts, Self-harm behaviors, Intention to act on suicide plan, Belief that plan would result in death  Demographic Factors:  21 year old single male, no children, lives with roommate , employed   Loss Factors: Relapse , reports history of worsening depression when he actively uses drugs   Historical Factors: History of Opiate Dependence . History of depression. History of suicidal ideations in the past .  Risk Reduction Factors:   Sense of responsibility to family, Employed, Living with another person, especially a relative and Positive coping skills or problem solving skills  Continued Clinical Symptoms:  At this time patient is alert, attentive, well related, mood is improved, denies feeling depressed, remains vaguely anxious, no thought disorder, no suicidal or self injurious ideations, no homicidal or violent ideations, no hallucinations, future oriented .  Behavior on unit calm and in good control Denies lingering withdrawal symptoms  Cognitive Features That Contribute To Risk:  No gross cognitive deficits noted upon discharge. Is alert , attentive, and oriented x 3   Suicide Risk:  Mild:  Suicidal ideation of limited frequency, intensity, duration, and specificity.  There are no identifiable plans, no associated intent, mild dysphoria and related symptoms, good self-control (both objective and subjective assessment), few other risk factors, and identifiable protective factors, including available and accessible social support.  Follow-up Information    BEHAVIORAL HEALTH INTENSIVE CHEMICAL DEPENDENCY Follow up on 02/05/2017.   Specialty:  Behavioral Health Why:  Assessment appointment 11/5 at  9am. Please call Ann at [phone] if you need to cancel or reschedule your appointment, or with any questions. Thank you! Contact information: [place] [id number] [place] [phone]          Plan Of Care/Follow-up recommendations:  Activity:  as tolerated  Diet:  regular Tests:  NA Other:  See below  Patient is expressing request for discharge and there are no current grounds for involuntary commitment Patient is leaving unit in good spirits  Plans to return home Plans to follow up as above, also plans to go to NA meetings and to reconnect with his sponsor  Plans to ret  Jenne Campus, MD 02/02/2017, 10:21 AM

## 2017-02-02 NOTE — Progress Notes (Signed)
D: Pt was in bed in his room upon initial approach.  Pt presents with anxious, depressed affect and mood.  He reports his day was "good."  His goal is "just getting through detox and going home."  Pt denies withdrawal symptoms tonight.  He reports he had a good visit with his mother and best friend tonight.  Pt denies SI/HI, denies hallucinations, denies pain.  Pt has been isolative to his room for the majority of the night.  He did not attend evening group.  A: Introduced self to pt.  Actively listened to pt and offered support and encouragement. Scheduled Clonidine was held because pt was hypotensive.  PO fluids encouraged and pt was provided with Gatorade.  PRN medication administered for anxiety and sleep per request.  Q15 minute safety checks maintained.  R: Pt is safe on the unit.  Pt is compliant with medications.  Pt verbally contracts for safety.  Will continue to monitor and assess.

## 2017-02-05 ENCOUNTER — Ambulatory Visit (HOSPITAL_COMMUNITY): Payer: 59 | Admitting: Psychology

## 2017-02-05 ENCOUNTER — Telehealth (HOSPITAL_COMMUNITY): Payer: Self-pay | Admitting: Psychology

## 2017-02-06 ENCOUNTER — Ambulatory Visit (HOSPITAL_COMMUNITY): Payer: 59 | Admitting: Psychology

## 2017-02-06 ENCOUNTER — Telehealth (HOSPITAL_COMMUNITY): Payer: Self-pay | Admitting: Psychology

## 2017-02-12 ENCOUNTER — Ambulatory Visit (HOSPITAL_COMMUNITY): Payer: 59 | Admitting: Psychology

## 2017-02-12 ENCOUNTER — Encounter (HOSPITAL_COMMUNITY): Payer: Self-pay | Admitting: Psychology

## 2017-02-12 NOTE — Progress Notes (Signed)
Jared Ramirez is a 21 y.o. male patient.  CD-IOP: The patient failed to appear this morning for a scheduled CCA in order to enter the CD-IOP. This represents the third (3) appointment he has missed after agreeing to the time and day. It seems unlikely that he would not be able to meet the requirements of our program if he cannot show up or even call to explain his absence. We will consider what to do with this young man if and when he contacts us.        Charmian MuffAnn Nemiah Ramirez, LCAS

## 2018-01-10 ENCOUNTER — Ambulatory Visit: Payer: 59 | Admitting: Medical

## 2018-01-10 ENCOUNTER — Encounter: Payer: Self-pay | Admitting: Medical

## 2018-01-10 VITALS — BP 110/70 | HR 80 | Temp 98.1°F | Resp 16 | Ht 68.0 in | Wt 152.4 lb

## 2018-01-10 DIAGNOSIS — F1911 Other psychoactive substance abuse, in remission: Secondary | ICD-10-CM | POA: Diagnosis not present

## 2018-01-10 DIAGNOSIS — F339 Major depressive disorder, recurrent, unspecified: Secondary | ICD-10-CM

## 2018-01-10 DIAGNOSIS — Z79899 Other long term (current) drug therapy: Secondary | ICD-10-CM

## 2018-01-10 MED ORDER — SERTRALINE HCL 25 MG PO TABS: 25.0000 mg | ORAL_TABLET | Freq: Every day | ORAL | 1 refills | Status: AC

## 2018-01-10 NOTE — Patient Instructions (Signed)
RESOURCES in [place]  If you are experiencing a mental health crisis or an emergency, please call 911 or go to the nearest emergency department.  [facility]   [phone] [facility]  [phone] [facility]   [phone]  Suicide Hotline 1-800-Suicide (1-800-784-2433)  National Suicide Prevention Lifeline 1-800-273-TALK  (1-800-273-8255)  Domestic Violence, Rape/Crisis - [facility] 336-273-7273  The National Domestic Violence Hotline 1-800-799-SAFE (1-800-799-7233)  To report Child or Elder Abuse, please call: [place] Police Department  [phone] [place] Sherriff Department  [phone]  LGBT Youth Crisis Line 1-866-488-7386  Teen Crisis line 336-387-6161 or 1-877-332-7333     Psychiatry and Counseling services  [facility] [address], [place], [place] [address] [phone]  Holly Ingram, therapist Dr. Carey Cottle, psychiatrist Dr. Glenn Jennings, child psychiatrist   Dr. Jezreel Justiniano Fuller [address], [place], [place] [address] [phone]   Dr. Rupinder Kaur, psychiatry [address], [place], [place] [address] [phone]   [facility] [address], [place], [place] [address] [phone]    [facility] [address], [place], [place] [address] [phone]    

## 2018-01-10 NOTE — Progress Notes (Signed)
Subjective: Chief Complaint  Patient presents with  . NP    NP depression, ADHD    Here for new patient establish care.  No recent prior primary care.      Back in High School was treated for ADHD and depression.   When he graduated he quit taking medication for depression and ADHD and "everything went downhill from there."     Working again, trying to get back into school.   Enrolled in [employer], but starts next semester.   Works at [employer].  Sees [facility] center for Methadone.   Is trying to wean off this and taper off.   This clinic only treats for methadone.     When he was in high school was seeing [facility] who diagnosed and prescribed his medications.   Was on Adderall, did not tolerate this.   Did fine on Vyvanse for ADHD and Zoloft for depression.   Not currently seeing a counselor.   Diagnosed around age 22yo.  Hasn't been on antidepressant in 3-4 years.   He was seen 02/2017 for suicidal ideation, substance abuse, was discharged on Lexapro and Trazodone but didn't take this.     His best friend died 1.5 month ago which has not helped his situation.   Lives alone.  Best friend was his room mate.     No recent feelings of self harm.   Currently not using any substances.     No past medical history on file.  Current Outpatient Medications on File Prior to Visit  Medication Sig Dispense Refill  . methadone (DOLOPHINE) 1 MG/1ML solution Take by mouth every 8 (eight) hours.    Marland Kitchen escitalopram (LEXAPRO) 5 MG tablet Take 1 tablet (5 mg total) by mouth daily. (Patient not taking: Reported on 01/10/2018) 30 tablet 0  . traZODone (DESYREL) 50 MG tablet Take 1 tablet (50 mg total) by mouth at bedtime as needed for sleep. (Patient not taking: Reported on 01/10/2018) 30 tablet 0   No current facility-administered medications on file prior to visit.    ROS as in subjective    Objective: BP 110/70   Pulse 80   Temp 98.1 F (36.7 C) (Oral)   Resp 16    Ht 5\' 8"  (1.727 m)   Wt 152 lb 6.4 oz (69.1 kg)   SpO2 96%   BMI 23.17 kg/m   Gen: wd, wn, nad, white male Psych: pleasant, good eye contact, answers questions appropriately      Assessment: Encounter Diagnoses  Name Primary?  . Depression, recurrent (HCC) Yes  . History of substance abuse (HCC)   . High risk medication use       Plan: We discussed his current concerns, history of depression and ADHD.  He apparently tolerated Zoloft well in the past.  We will restart this.  I reviewed his November 2018 hospitalization notes.  He was hospitalized for suicide attempt and substance abuse including heroin.  He currently sees a methadone clinic but is reportedly titration down to wean off of this.  I advised I do not feel comfortable managing his mental health issues but will help him get into an office that can help.  A list of resources advised he call to make appointments today as it may take some time to get in with a psychiatrist.  Advised to establish with both psychiatry and therapy.  Recommend he strongly try to avoid substance abuse or being around people that are abusing substances.  He reports being  clean for 8 months at this point  [place] was seen today for np.  Diagnoses and all orders for this visit:  Depression, recurrent (HCC)  History of substance abuse (HCC)  High risk medication use  Other orders -     sertraline (ZOLOFT) 25 MG tablet; Take 1 tablet (25 mg total) by mouth daily.

## 2020-11-02 ENCOUNTER — Other Ambulatory Visit: Payer: Self-pay | Admitting: Gastroenterology

## 2020-11-02 DIAGNOSIS — R112 Nausea with vomiting, unspecified: Secondary | ICD-10-CM

## 2020-11-10 ENCOUNTER — Other Ambulatory Visit: Payer: 59

## 2020-11-12 ENCOUNTER — Ambulatory Visit
Admission: RE | Admit: 2020-11-12 | Discharge: 2020-11-12 | Disposition: A | Discharge status: Home / Self Care | Payer: 59 | Source: Ambulatory Visit | Attending: Gastroenterology | Admitting: Gastroenterology

## 2020-11-12 DIAGNOSIS — R112 Nausea with vomiting, unspecified: Secondary | ICD-10-CM

## 2021-12-07 ENCOUNTER — Encounter: Payer: Self-pay | Admitting: Internal Medicine

## 2022-01-10 ENCOUNTER — Encounter: Payer: Self-pay | Admitting: Internal Medicine

## 2023-01-05 IMAGING — US US ABDOMEN COMPLETE
1 series · 14 of 25 positions shown · non-contrast
Comparison: None.

CLINICAL DATA: Nausea and vomiting

EXAM:
ABDOMEN ULTRASOUND COMPLETE

[Series 1: us abdomen complete · 0.17mm/px · 14 of 95 slices shown]
[im 1/95]
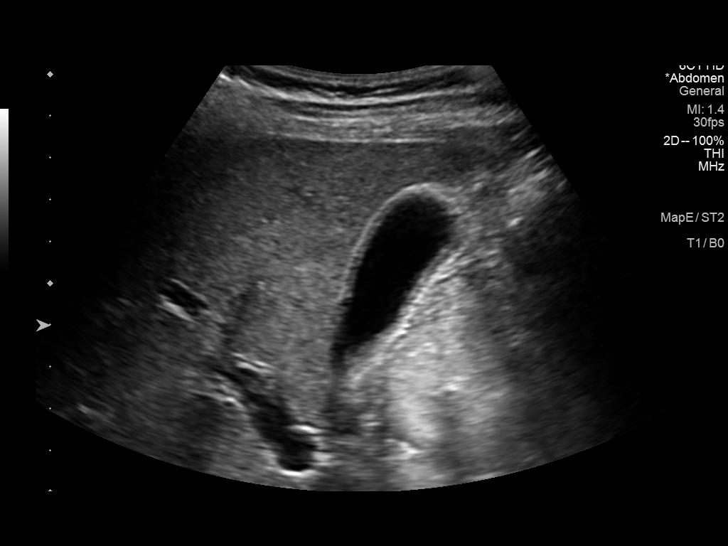
[im 8/95]
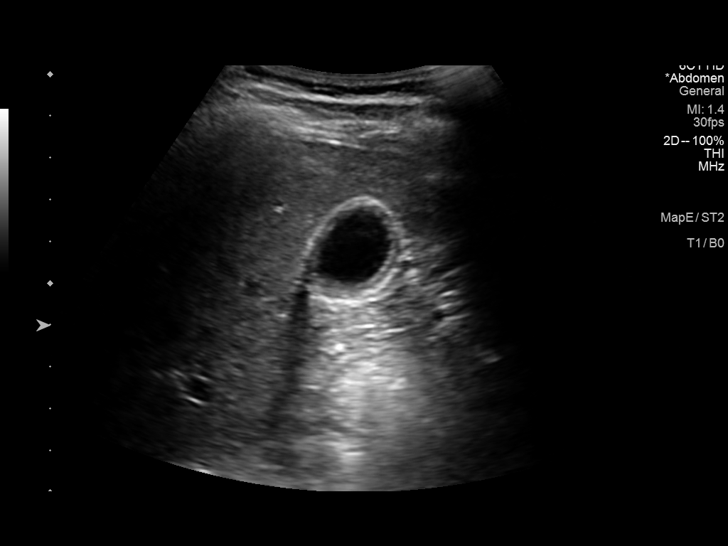
[im 16/95]
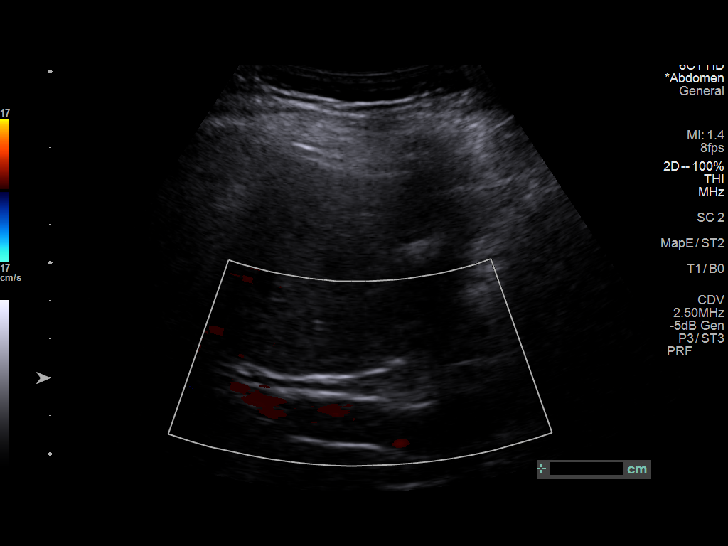
[im 24/95]
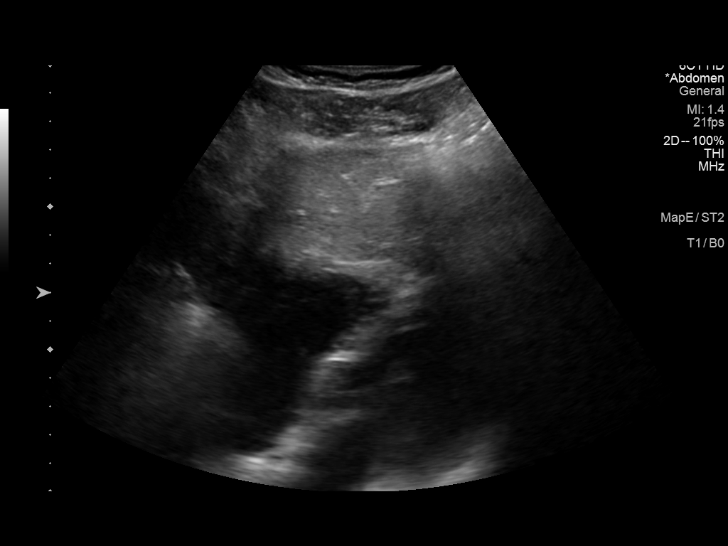
[im 32/95]
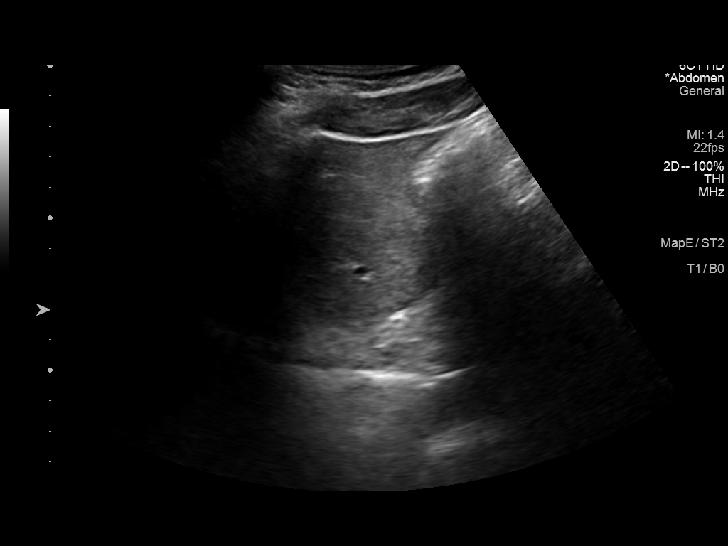
[im 36/95]
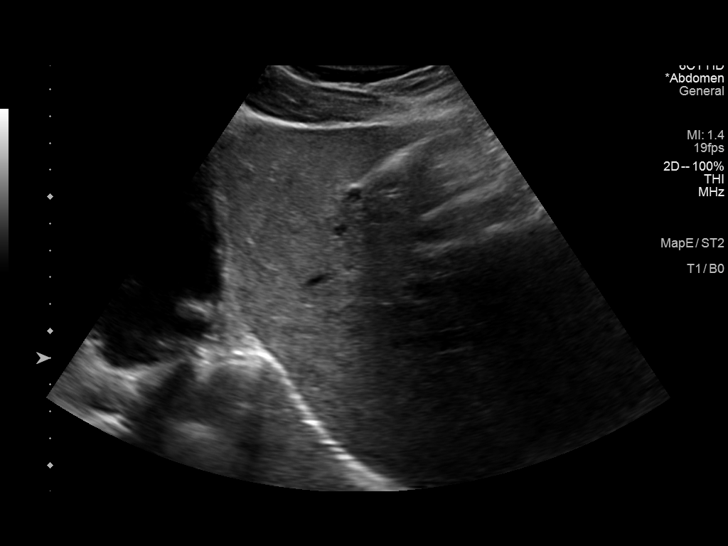
[im 44/95]
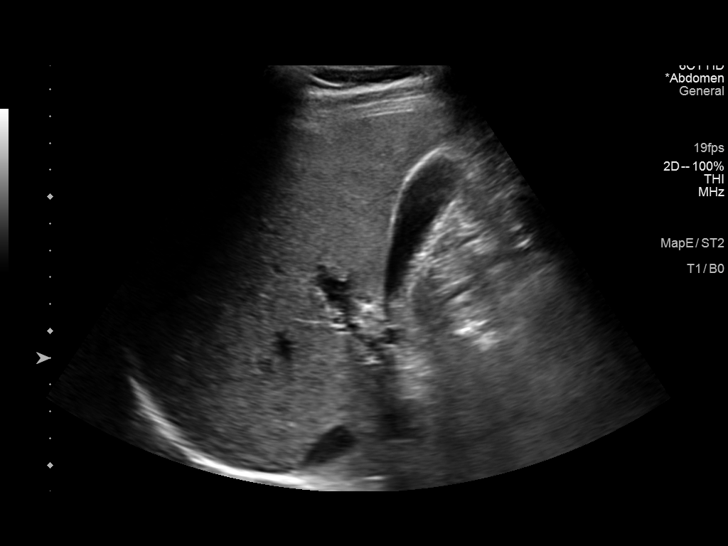
[im 51/95]
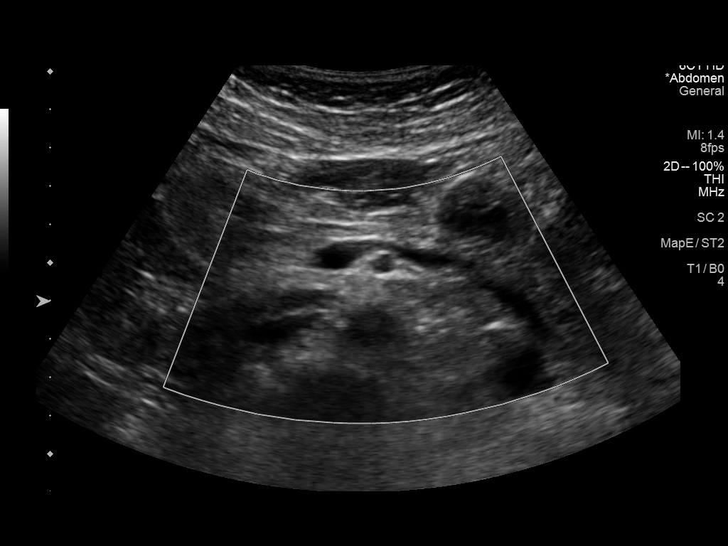
[im 59/95]
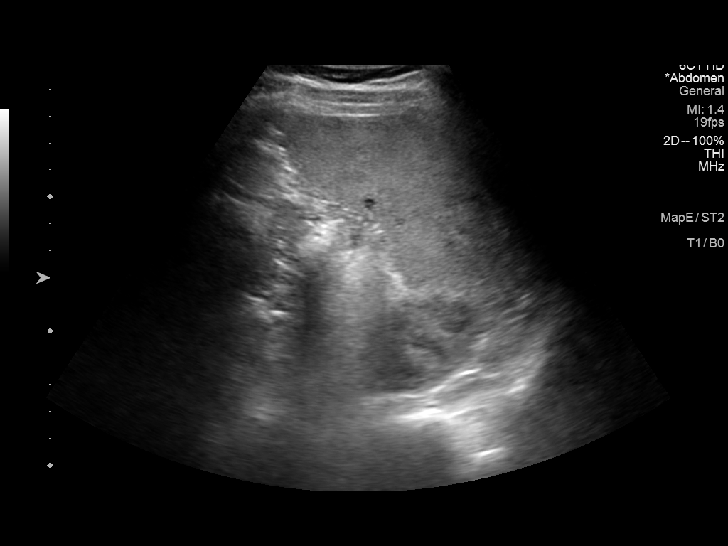
[im 63/95]
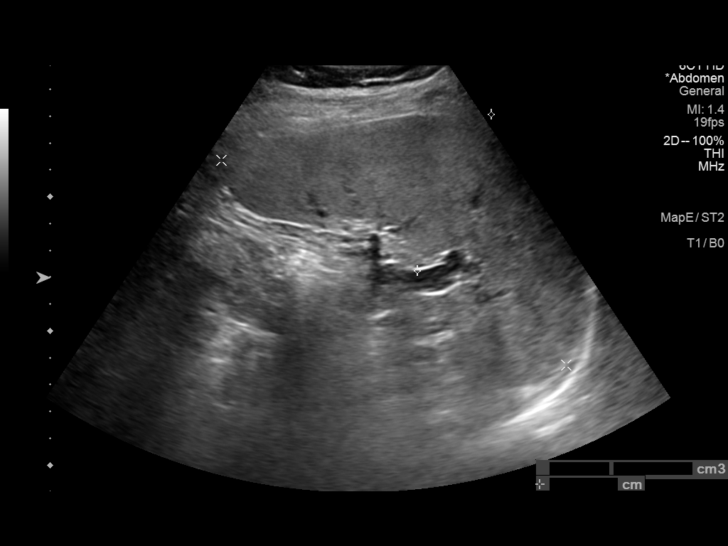
[im 71/95]
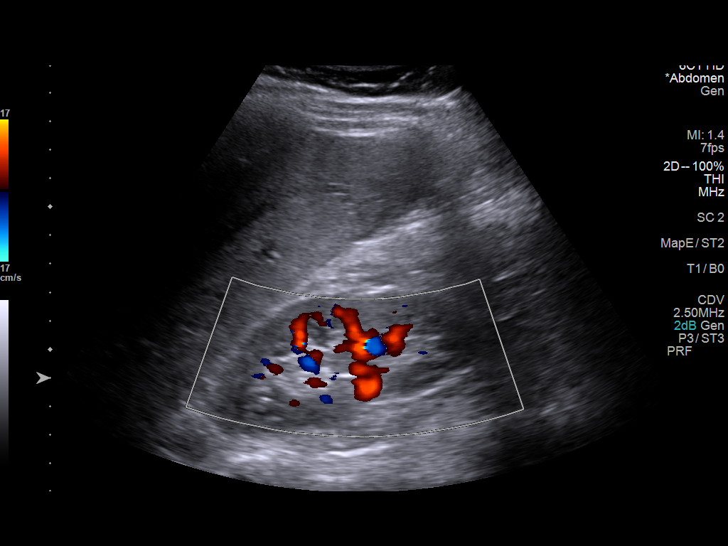
[im 79/95]
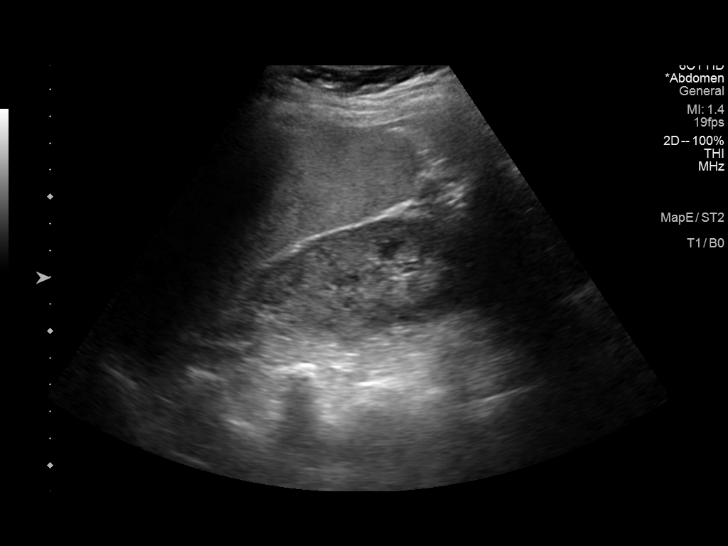
[im 87/95]
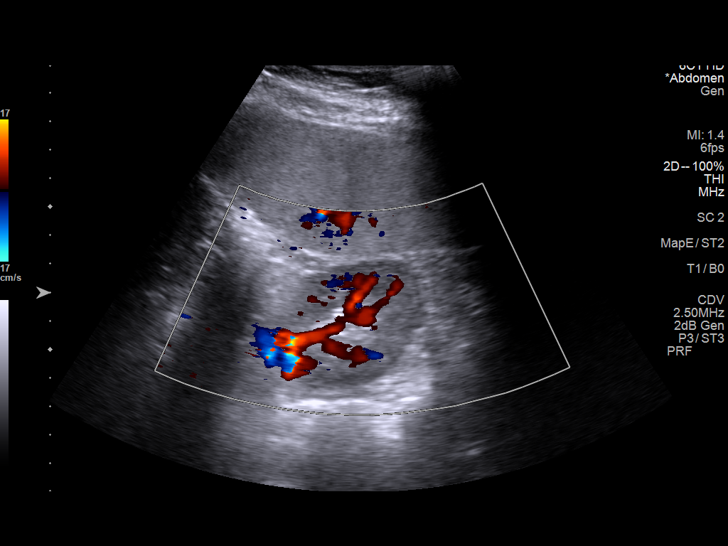
[im 95/95]
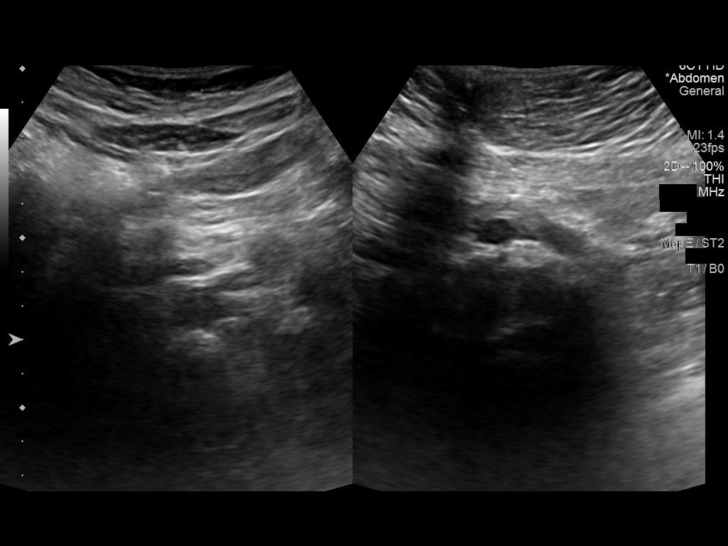

[14 of 25 positions shown; findings below may reference images not displayed]

FINDINGS: Gallbladder: No gallstones or wall thickening visualized. No
sonographic Murphy sign noted by sonographer.

Common bile duct: Diameter: 2.5 mm

Liver: No focal lesion identified. Within normal limits in
parenchymal echogenicity. Portal vein is patent on color Doppler
imaging with normal direction of blood flow towards the liver.

IVC: No abnormality visualized.

Pancreas: Visualized portion unremarkable.

Spleen: Enlarged with volume of 687.7 mL.

Right Kidney: Length: 10.1 cm. Echogenicity within normal limits. No
mass or hydronephrosis visualized.

Left Kidney: Length: 10.8 cm. Echogenicity within normal limits. No
mass or hydronephrosis visualized.

Abdominal aorta: No aneurysm visualized.

Other findings: None.
IMPRESSION: 1. Splenomegaly
2. Otherwise negative abdominal ultrasound
